# Patient Record
Sex: Female | Born: 1975 | Race: Black or African American | Hispanic: No | Marital: Single | Smoking: Never smoker
Health system: Southern US, Community
[De-identification: ages and names within clinical notes are randomized; demographics above are authoritative.]

## PROBLEM LIST (undated history)

## (undated) DIAGNOSIS — D649 Anemia, unspecified: Secondary | ICD-10-CM

## (undated) DIAGNOSIS — M5136 Other intervertebral disc degeneration, lumbar region: Secondary | ICD-10-CM

## (undated) DIAGNOSIS — E7800 Pure hypercholesterolemia, unspecified: Secondary | ICD-10-CM

## (undated) DIAGNOSIS — N809 Endometriosis, unspecified: Secondary | ICD-10-CM

## (undated) DIAGNOSIS — I10 Essential (primary) hypertension: Secondary | ICD-10-CM

## (undated) DIAGNOSIS — G549 Nerve root and plexus disorder, unspecified: Secondary | ICD-10-CM

## (undated) DIAGNOSIS — G43909 Migraine, unspecified, not intractable, without status migrainosus: Secondary | ICD-10-CM

## (undated) DIAGNOSIS — M62830 Muscle spasm of back: Secondary | ICD-10-CM

## (undated) DIAGNOSIS — R809 Proteinuria, unspecified: Secondary | ICD-10-CM

## (undated) DIAGNOSIS — F4310 Post-traumatic stress disorder, unspecified: Secondary | ICD-10-CM

## (undated) DIAGNOSIS — J45909 Unspecified asthma, uncomplicated: Secondary | ICD-10-CM

## (undated) DIAGNOSIS — Q6650 Congenital pes planus, unspecified foot: Secondary | ICD-10-CM

## (undated) DIAGNOSIS — F419 Anxiety disorder, unspecified: Secondary | ICD-10-CM

## (undated) DIAGNOSIS — M722 Plantar fascial fibromatosis: Secondary | ICD-10-CM

## (undated) DIAGNOSIS — K589 Irritable bowel syndrome without diarrhea: Secondary | ICD-10-CM

## (undated) HISTORY — DX: Anxiety disorder, unspecified: F41.9

## (undated) HISTORY — DX: Unspecified asthma, uncomplicated: J45.909

## (undated) HISTORY — DX: Endometriosis, unspecified: N80.9

## (undated) HISTORY — DX: Plantar fascial fibromatosis: M72.2

## (undated) HISTORY — PX: OTHER SURGICAL HISTORY: SHX169

## (undated) HISTORY — PX: DILATION AND CURETTAGE OF UTERUS: SHX78

## (undated) HISTORY — DX: Post-traumatic stress disorder, unspecified: F43.10

## (undated) HISTORY — PX: TONSILLECTOMY: SUR1361

## (undated) HISTORY — DX: Nerve root and plexus disorder, unspecified: G54.9

## (undated) HISTORY — DX: Other intervertebral disc degeneration, lumbar region: M51.36

## (undated) HISTORY — DX: Anemia, unspecified: D64.9

## (undated) HISTORY — DX: Proteinuria, unspecified: R80.9

## (undated) HISTORY — DX: Pure hypercholesterolemia, unspecified: E78.00

## (undated) HISTORY — PX: ESOPHAGOGASTRODUODENOSCOPY: SHX1529

## (undated) HISTORY — DX: Muscle spasm of back: M62.830

## (undated) HISTORY — DX: Congenital pes planus, unspecified foot: Q66.50

## (undated) HISTORY — DX: Essential (primary) hypertension: I10

## (undated) HISTORY — DX: Irritable bowel syndrome without diarrhea: K58.9

## (undated) HISTORY — PX: CYSTOURETHROSCOPY: SHX476

## (undated) HISTORY — DX: Migraine, unspecified, not intractable, without status migrainosus: G43.909

---

## 2012-10-31 DIAGNOSIS — I10 Essential (primary) hypertension: Secondary | ICD-10-CM | POA: Insufficient documentation

## 2012-10-31 DIAGNOSIS — F411 Generalized anxiety disorder: Secondary | ICD-10-CM | POA: Insufficient documentation

## 2012-10-31 DIAGNOSIS — E559 Vitamin D deficiency, unspecified: Secondary | ICD-10-CM | POA: Insufficient documentation

## 2012-10-31 DIAGNOSIS — E66813 Obesity, class 3: Secondary | ICD-10-CM | POA: Insufficient documentation

## 2012-10-31 DIAGNOSIS — F4310 Post-traumatic stress disorder, unspecified: Secondary | ICD-10-CM | POA: Insufficient documentation

## 2012-10-31 DIAGNOSIS — M545 Low back pain: Secondary | ICD-10-CM | POA: Insufficient documentation

## 2012-10-31 DIAGNOSIS — K921 Melena: Secondary | ICD-10-CM | POA: Insufficient documentation

## 2014-01-15 DIAGNOSIS — N8003 Adenomyosis of the uterus: Secondary | ICD-10-CM | POA: Insufficient documentation

## 2014-01-15 DIAGNOSIS — N8000 Endometriosis of the uterus, unspecified: Secondary | ICD-10-CM | POA: Insufficient documentation

## 2014-06-19 DIAGNOSIS — T8339XA Other mechanical complication of intrauterine contraceptive device, initial encounter: Secondary | ICD-10-CM | POA: Insufficient documentation

## 2015-10-21 DIAGNOSIS — N926 Irregular menstruation, unspecified: Secondary | ICD-10-CM | POA: Insufficient documentation

## 2015-10-21 DIAGNOSIS — A5901 Trichomonal vulvovaginitis: Secondary | ICD-10-CM | POA: Insufficient documentation

## 2015-10-24 DIAGNOSIS — J22 Unspecified acute lower respiratory infection: Secondary | ICD-10-CM | POA: Insufficient documentation

## 2016-03-02 DIAGNOSIS — D251 Intramural leiomyoma of uterus: Secondary | ICD-10-CM | POA: Insufficient documentation

## 2016-09-28 DIAGNOSIS — M62830 Muscle spasm of back: Secondary | ICD-10-CM | POA: Insufficient documentation

## 2016-10-13 DIAGNOSIS — R1900 Intra-abdominal and pelvic swelling, mass and lump, unspecified site: Secondary | ICD-10-CM | POA: Insufficient documentation

## 2016-10-13 DIAGNOSIS — N1339 Other hydronephrosis: Secondary | ICD-10-CM | POA: Insufficient documentation

## 2016-11-03 DIAGNOSIS — M5416 Radiculopathy, lumbar region: Secondary | ICD-10-CM | POA: Insufficient documentation

## 2016-11-07 DIAGNOSIS — N921 Excessive and frequent menstruation with irregular cycle: Secondary | ICD-10-CM | POA: Insufficient documentation

## 2016-11-07 DIAGNOSIS — N390 Urinary tract infection, site not specified: Secondary | ICD-10-CM

## 2016-11-07 DIAGNOSIS — Z860100 Personal history of colon polyps, unspecified: Secondary | ICD-10-CM | POA: Insufficient documentation

## 2016-11-07 DIAGNOSIS — A419 Sepsis, unspecified organism: Secondary | ICD-10-CM | POA: Insufficient documentation

## 2016-11-07 DIAGNOSIS — N135 Crossing vessel and stricture of ureter without hydronephrosis: Secondary | ICD-10-CM | POA: Insufficient documentation

## 2016-12-22 DIAGNOSIS — R5383 Other fatigue: Secondary | ICD-10-CM | POA: Insufficient documentation

## 2017-01-10 DIAGNOSIS — D649 Anemia, unspecified: Secondary | ICD-10-CM | POA: Insufficient documentation

## 2017-03-04 DIAGNOSIS — R8271 Bacteriuria: Secondary | ICD-10-CM | POA: Insufficient documentation

## 2017-03-23 DIAGNOSIS — M722 Plantar fascial fibromatosis: Secondary | ICD-10-CM | POA: Insufficient documentation

## 2017-04-12 DIAGNOSIS — M51379 Other intervertebral disc degeneration, lumbosacral region without mention of lumbar back pain or lower extremity pain: Secondary | ICD-10-CM | POA: Insufficient documentation

## 2017-04-12 DIAGNOSIS — M5030 Other cervical disc degeneration, unspecified cervical region: Secondary | ICD-10-CM | POA: Insufficient documentation

## 2017-04-12 DIAGNOSIS — M5137 Other intervertebral disc degeneration, lumbosacral region: Secondary | ICD-10-CM | POA: Insufficient documentation

## 2017-04-12 DIAGNOSIS — Z9884 Bariatric surgery status: Secondary | ICD-10-CM | POA: Insufficient documentation

## 2017-05-23 DIAGNOSIS — N2889 Other specified disorders of kidney and ureter: Secondary | ICD-10-CM | POA: Insufficient documentation

## 2017-07-22 DIAGNOSIS — N3001 Acute cystitis with hematuria: Secondary | ICD-10-CM | POA: Insufficient documentation

## 2017-08-10 DIAGNOSIS — R059 Cough, unspecified: Secondary | ICD-10-CM | POA: Insufficient documentation

## 2017-08-10 DIAGNOSIS — R05 Cough: Secondary | ICD-10-CM | POA: Insufficient documentation

## 2017-08-10 DIAGNOSIS — H1033 Unspecified acute conjunctivitis, bilateral: Secondary | ICD-10-CM | POA: Insufficient documentation

## 2017-08-10 DIAGNOSIS — J0100 Acute maxillary sinusitis, unspecified: Secondary | ICD-10-CM | POA: Insufficient documentation

## 2018-01-27 DIAGNOSIS — J4520 Mild intermittent asthma, uncomplicated: Secondary | ICD-10-CM | POA: Insufficient documentation

## 2018-08-25 ENCOUNTER — Encounter (INDEPENDENT_AMBULATORY_CARE_PROVIDER_SITE_OTHER): Payer: Self-pay

## 2018-08-25 ENCOUNTER — Encounter: Payer: Self-pay | Admitting: Oncology

## 2018-08-25 ENCOUNTER — Other Ambulatory Visit: Payer: Self-pay

## 2018-08-25 ENCOUNTER — Inpatient Hospital Stay: Payer: Federal, State, Local not specified - PPO | Admitting: *Deleted

## 2018-08-25 ENCOUNTER — Inpatient Hospital Stay: Payer: Federal, State, Local not specified - PPO | Attending: Oncology | Admitting: Oncology

## 2018-08-25 VITALS — BP 143/85 | HR 82 | Temp 97.8°F | Ht 68.0 in | Wt 216.1 lb

## 2018-08-25 DIAGNOSIS — Z9884 Bariatric surgery status: Secondary | ICD-10-CM | POA: Diagnosis not present

## 2018-08-25 DIAGNOSIS — D649 Anemia, unspecified: Secondary | ICD-10-CM

## 2018-08-25 DIAGNOSIS — D509 Iron deficiency anemia, unspecified: Secondary | ICD-10-CM | POA: Insufficient documentation

## 2018-08-25 LAB — CBC
HCT: 39 % (ref 36.0–46.0)
Hemoglobin: 12.7 g/dL (ref 12.0–15.0)
MCH: 27 pg (ref 26.0–34.0)
MCHC: 32.6 g/dL (ref 30.0–36.0)
MCV: 82.8 fL (ref 80.0–100.0)
Platelets: 339 10*3/uL (ref 150–400)
RBC: 4.71 MIL/uL (ref 3.87–5.11)
RDW: 15.1 % (ref 11.5–15.5)
WBC: 4.4 10*3/uL (ref 4.0–10.5)
nRBC: 0 % (ref 0.0–0.2)

## 2018-08-25 LAB — RETICULOCYTES
Immature Retic Fract: 5.5 % (ref 2.3–15.9)
RBC.: 4.71 MIL/uL (ref 3.87–5.11)
RETIC CT PCT: 1.1 % (ref 0.4–3.1)
Retic Count, Absolute: 50.9 10*3/uL (ref 19.0–186.0)

## 2018-08-25 LAB — URINALYSIS, COMPLETE (UACMP) WITH MICROSCOPIC
BACTERIA UA: NONE SEEN
Bilirubin Urine: NEGATIVE
Glucose, UA: NEGATIVE mg/dL
Hgb urine dipstick: NEGATIVE
Ketones, ur: NEGATIVE mg/dL
Leukocytes,Ua: NEGATIVE
Nitrite: NEGATIVE
Protein, ur: NEGATIVE mg/dL
Specific Gravity, Urine: 1.026 (ref 1.005–1.030)
pH: 6 (ref 5.0–8.0)

## 2018-08-25 LAB — IRON AND TIBC
Iron: 41 ug/dL (ref 28–170)
Saturation Ratios: 9 % — ABNORMAL LOW (ref 10.4–31.8)
TIBC: 441 ug/dL (ref 250–450)
UIBC: 400 ug/dL

## 2018-08-25 LAB — FOLATE: Folate: 32 ng/mL (ref >5.9)

## 2018-08-25 LAB — VITAMIN B12: Vitamin B-12: 1121 pg/mL — ABNORMAL HIGH (ref 180–914)

## 2018-08-25 LAB — FERRITIN: Ferritin: 9 ng/mL — ABNORMAL LOW (ref 11–307)

## 2018-08-25 NOTE — Progress Notes (Signed)
Patient is here today to establish care for anemia. 

## 2018-08-28 ENCOUNTER — Encounter: Payer: Self-pay | Admitting: Oncology

## 2018-08-28 DIAGNOSIS — D509 Iron deficiency anemia, unspecified: Secondary | ICD-10-CM | POA: Insufficient documentation

## 2018-08-28 NOTE — Progress Notes (Signed)
Hematology/Oncology Consult note [facility] [phone] [phone]  Patient Care Team: Remi Haggard, FNP as PCP - General (Family Medicine)   Name of the patient: Jacqueline Caldwell  [id number]  11/10/1975    Reason for referral- anemia   Referring physician- Threasa Alpha FNP  Date of visit: 08/28/18   History of presenting illness-patient is a 43 year old African-American female with a past medical history significant for gastric bypass surgery in 2014.  She has also had history of endometriosis which affected her left kidney and she has required multiple excisions for the same.  She was almost to the point of losing her left kidney but was able to get that salvaged.  She has also had EGD and colonoscopy done in the past at [facility] in 2018.  Colonoscopy was normal and EGD showed 4 mm nodules in the stomach which were negative for malignancy she is currently on Lupron for endometriosis and since then she has not had her menstrual cycles.  She has been referred to [place] for iron deficiency anemia.  Recent blood work from 08/07/2018 showed white count of 3.5, H&H of 12.5/38.4 with an MCV of 81 and a platelet count of 266.  CMP was within normal limits.  Iron saturation was low at 8%.  TSH was high at 4.6 with a low T3 of 23.  B12 was high normal at 1590.  Folate was normal.  Ferritin was low at 24.  Patient denies any consistent use of NSAIDs.  Denies any blood in her stools or dark melanotic stools.  ECOG PS- 0  Pain scale- 0   Review of systems- Review of Systems  Constitutional: Negative for chills, fever, malaise/fatigue and weight loss.  HENT: Negative for congestion, ear discharge and nosebleeds.   Eyes: Negative for blurred vision.  Respiratory: Negative for cough, hemoptysis, sputum production, shortness of breath and wheezing.   Cardiovascular: Negative for chest pain, palpitations, orthopnea and claudication.  Gastrointestinal:  Negative for abdominal pain, blood in stool, constipation, diarrhea, heartburn, melena, nausea and vomiting.  Genitourinary: Negative for dysuria, flank pain, frequency, hematuria and urgency.  Musculoskeletal: Negative for back pain, joint pain and myalgias.  Skin: Negative for rash.  Neurological: Negative for dizziness, tingling, focal weakness, seizures, weakness and headaches.  Endo/Heme/Allergies: Does not bruise/bleed easily.  Psychiatric/Behavioral: Negative for depression and suicidal ideas. The patient does not have insomnia.     Allergies  Allergen Reactions  . Levofloxacin Hives    Other reaction(s): Unknown  . Tape     Other reaction(s): Other (See Comments) Burns the skin.     Patient Active Problem List   Diagnosis Date Noted  . Mild intermittent asthma without complication 01/13/1974  . Acute bacterial conjunctivitis of both eyes 08/10/2017  . Acute non-recurrent maxillary sinusitis 08/10/2017  . Cough 08/10/2017  . Acute cystitis with hematuria 07/22/2017  . Ureteral mass 05/23/2017  . DDD (degenerative disc disease), cervical 04/12/2017  . Degeneration of lumbosacral intervertebral disc 04/12/2017  . History of bariatric surgery 04/12/2017  . Plantar fasciitis 03/23/2017  . Bacteria in urine 03/04/2017  . Absolute anemia 01/10/2017  . Other fatigue 12/22/2016  . History of colon polyps 11/07/2016  . Menometrorrhagia 11/07/2016  . Sepsis due to urinary tract infection ([place]) 11/07/2016  . Ureteral obstruction, left 11/07/2016  . Lumbar radiculopathy, chronic 11/03/2016  . Other hydronephrosis 10/13/2016  . Retroperitoneal mass 10/13/2016  . Back muscle spasm 09/28/2016  . Intramural leiomyoma of uterus 03/02/2016  . Lower  resp. tract infection 10/24/2015  . Irregular menses 10/21/2015  . Trichomonal vaginitis 10/21/2015  . IUD mechanical complication 27/09/5007  . Uterus, adenomyosis 01/15/2014  . Anxiety state 10/31/2012  . Essential hypertension  10/31/2012  . Hematochezia 10/31/2012  . Low back pain 10/31/2012  . Obesity, Class III, BMI 40-49.9 (morbid obesity) ([place]) 10/31/2012  . Posttraumatic stress disorder 10/31/2012  . Vitamin D deficiency 10/31/2012     Past Medical History:  Diagnosis Date  . Anemia   . Anxiety disorder   . Asthma   . Congenital pes planus   . Endometriosis   . Endometriosis   . Essential hypertension   . IBS (irritable bowel syndrome)   . Migraine   . Muscle spasm of back   . Nerve root and plexus disorder, unspecified   . Other intervertebral disc degeneration, lumbar region   . Plantar fascial fibromatosis   . Proteinuria   . PTSD (post-traumatic stress disorder)      Past Surgical History:  Procedure Laterality Date  . APPENDECTOMY    . biopsy cervix single/mult/excision of lesion spx    . CYSTOURETHROSCOPY    . DILATION AND CURETTAGE OF UTERUS    . endometrial bx conjunct w/ colposcopy    . ESOPHAGOGASTRODUODENOSCOPY    . laps gastrc rstrictiv px longitudinal gastrectomy    . TONSILLECTOMY    . ureteroplasty plastic operation ureter      Social History   Socioeconomic History  . Marital status: Single    Spouse name: Not on file  . Number of children: Not on file  . Years of education: Not on file  . Highest education level: Not on file  Occupational History  . Not on file  Social Needs  . Financial resource strain: Not on file  . Food insecurity:    Worry: Not on file    Inability: Not on file  . Transportation needs:    Medical: Not on file    Non-medical: Not on file  Tobacco Use  . Smoking status: Never Smoker  . Smokeless tobacco: Never Used  Substance and Sexual Activity  . Alcohol use: Yes  . Drug use: Never  . Sexual activity: Not on file  Lifestyle  . Physical activity:    Days per week: Not on file    Minutes per session: Not on file  . Stress: Not on file  Relationships  . Social connections:    Talks on phone: Not on file    Gets together: Not  on file    Attends religious service: Not on file    Active member of club or organization: Not on file    Attends meetings of clubs or organizations: Not on file    Relationship status: Not on file  . Intimate partner violence:    Fear of current or ex partner: Not on file    Emotionally abused: Not on file    Physically abused: Not on file    Forced sexual activity: Not on file  Other Topics Concern  . Not on file  Social History Narrative  . Not on file     History reviewed. No pertinent family history.   Current Outpatient Medications:  .  albuterol (PROVENTIL HFA;VENTOLIN HFA) 108 (90 Base) MCG/ACT inhaler, Inhale 2 puffs into the lungs as needed., Disp: , Rfl:  .  ARIPiprazole (ABILIFY) 10 MG tablet, Take 1 tablet by mouth 1 day or 1 dose., Disp: , Rfl:  .  b complex vitamins tablet,  Take 1 tablet by mouth 1 day or 1 dose., Disp: , Rfl:  .  Calcium 500-100 MG-UNIT CHEW, Chew 1 tablet by mouth., Disp: , Rfl:  .  Cyanocobalamin (VITAMIN B 12) 500 MCG TABS, Take 1 tablet by mouth., Disp: , Rfl:  .  dicyclomine (BENTYL) 10 MG capsule, Take 1 mg by mouth 1 day or 1 dose., Disp: , Rfl:  .  docusate sodium (COLACE) 100 MG capsule, Take 1 capsule by mouth 1 day or 1 dose., Disp: , Rfl:  .  escitalopram (LEXAPRO) 10 MG tablet, Take 1 tablet by mouth 1 day or 1 dose., Disp: , Rfl:  .  FLUoxetine (PROZAC) 40 MG capsule, Take 1 capsule by mouth 1 day or 1 dose., Disp: , Rfl:  .  Fluticasone-Salmeterol (ADVAIR) 250-50 MCG/DOSE AEPB, Inhale 1 puff into the lungs 1 day or 1 dose., Disp: , Rfl:  .  gabapentin (NEURONTIN) 100 MG capsule, Take 1 capsule by mouth 2 (two) times daily., Disp: , Rfl:  .  linaclotide (LINZESS) 72 MCG capsule, Take 1 capsule by mouth 1 day or 1 dose., Disp: , Rfl:  .  methocarbamol (ROBAXIN) 750 MG tablet, Take 1 tablet by mouth 1 day or 1 dose., Disp: , Rfl:  .  montelukast (SINGULAIR) 10 MG tablet, Take 1 tablet by mouth 1 day or 1 dose., Disp: , Rfl:  .  Multiple  Vitamins-Minerals (COMPLETE MULTIVITAMIN/MINERAL) LIQD, Take 1 tablet by mouth 1 day or 1 dose., Disp: , Rfl:  .  prazosin (MINIPRESS) 1 MG capsule, Take 1 capsule by mouth 1 day or 1 dose., Disp: , Rfl:  .  SUMAtriptan (IMITREX) 50 MG tablet, Take 50 mg by mouth daily., Disp: , Rfl:  .  traZODone (DESYREL) 50 MG tablet, Take 1 tablet by mouth as needed., Disp: , Rfl:    Physical exam:  Vitals:   08/25/18 1105  BP: (!) 143/85  Pulse: 82  Temp: 97.8 F (36.6 C)  TempSrc: Tympanic  Weight: 216 lb 1.6 oz (98 kg)  Height: 5\' 8"  (1.727 m)   Physical Exam Constitutional:      General: She is not in acute distress. HENT:     Head: Normocephalic and atraumatic.  Eyes:     Pupils: Pupils are equal, round, and reactive to light.  Neck:     Musculoskeletal: Normal range of motion.  Cardiovascular:     Rate and Rhythm: Normal rate and regular rhythm.     Heart sounds: Normal heart sounds.  Pulmonary:     Effort: Pulmonary effort is normal.     Breath sounds: Normal breath sounds.  Abdominal:     General: Bowel sounds are normal.     Palpations: Abdomen is soft.  Skin:    General: Skin is warm and dry.  Neurological:     Mental Status: She is alert and oriented to person, place, and time.        No flowsheet data found. CBC Latest Ref Rng & Units 08/25/2018  WBC 4.0 - 10.5 K/uL 4.4  Hemoglobin 12.0 - 15.0 g/dL 12.7  Hematocrit 36.0 - 46.0 % 39.0  Platelets 150 - 400 K/uL 339    No images are attached to the encounter.  No results found.  Assessment and plan- Patient is a 43 y.o. female referred for iron deficiency anemia  Iron deficiency anemia possibly secondary to gastric bypass.  She is currently on Lupron and is not getting her menstrual cycles.  She has had EGD  and colonoscopy in 2018.  Today I will check a CBC, ferritin and iron studies, B12 and folate, reticulocyte count stool H. pylori antigen as well as urinalysis.  If the results of her blood work are consistent  with iron deficiency we will Give her a call and set her up for IV iron  I would recommend 2 doses of Feraheme 500 mg IV weekly.  Discussed risks and benefits of IV iron including all but not limited to headache, leg swelling and possible risk of infusion reaction.  Patient understands and agrees to proceed as planned  I will see her back in 2 months time with repeat CBC ferritin and iron studies and discuss potential GI evaluation at that time.  Patient has now moved her care to [facility] and does not wish to go back to [facility] for her subsequent endoscopies if need be.   Thank you for this kind referral and the opportunity to participate in the care of this patient   Visit Diagnosis 1. Iron deficiency anemia, unspecified iron deficiency anemia type     Dr. Randa Evens, MD, MPH [facility] [phone] 08/28/2018  4:48 PM

## 2018-08-29 NOTE — Progress Notes (Signed)
Verdis Frederickson called patient and I was by her side and told patient ferritin results and pt agreeable for 2 doses feraheme and appts given to patient and scheduled by Verdis Frederickson

## 2018-08-30 ENCOUNTER — Other Ambulatory Visit: Payer: Self-pay | Admitting: *Deleted

## 2018-08-30 DIAGNOSIS — D509 Iron deficiency anemia, unspecified: Secondary | ICD-10-CM

## 2018-09-01 ENCOUNTER — Inpatient Hospital Stay: Payer: Federal, State, Local not specified - PPO

## 2018-09-01 ENCOUNTER — Encounter (INDEPENDENT_AMBULATORY_CARE_PROVIDER_SITE_OTHER): Payer: Self-pay

## 2018-09-01 VITALS — BP 117/70

## 2018-09-01 DIAGNOSIS — D509 Iron deficiency anemia, unspecified: Secondary | ICD-10-CM | POA: Diagnosis not present

## 2018-09-01 LAB — H. PYLORI ANTIGEN, STOOL: H. PYLORI STOOL AG, EIA: NEGATIVE

## 2018-09-01 MED ORDER — SODIUM CHLORIDE 0.9 % IV SOLN
Freq: Once | INTRAVENOUS | Status: AC
  Administered 2018-09-01: 12:00:00 via INTRAVENOUS
  Filled 2018-09-01: qty 250

## 2018-09-01 MED ORDER — SODIUM CHLORIDE 0.9 % IV SOLN
510.0000 mg | Freq: Once | INTRAVENOUS | Status: AC
  Administered 2018-09-01: 510 mg via INTRAVENOUS
  Filled 2018-09-01: qty 17

## 2018-09-08 ENCOUNTER — Inpatient Hospital Stay: Payer: Federal, State, Local not specified - PPO | Attending: Oncology

## 2018-09-08 VITALS — BP 124/85 | HR 64 | Temp 96.9°F | Resp 18

## 2018-09-08 DIAGNOSIS — D509 Iron deficiency anemia, unspecified: Secondary | ICD-10-CM | POA: Diagnosis present

## 2018-09-08 MED ORDER — SODIUM CHLORIDE 0.9 % IV SOLN
510.0000 mg | Freq: Once | INTRAVENOUS | Status: AC
  Administered 2018-09-08: 510 mg via INTRAVENOUS
  Filled 2018-09-08: qty 17

## 2018-09-08 MED ORDER — SODIUM CHLORIDE 0.9 % IV SOLN
Freq: Once | INTRAVENOUS | Status: AC
  Administered 2018-09-08: 14:00:00 via INTRAVENOUS
  Filled 2018-09-08: qty 250

## 2018-09-12 ENCOUNTER — Encounter: Payer: Self-pay | Admitting: Obstetrics and Gynecology

## 2018-09-13 ENCOUNTER — Encounter: Payer: Self-pay | Admitting: Obstetrics and Gynecology

## 2018-10-30 ENCOUNTER — Encounter: Payer: Self-pay | Admitting: Obstetrics and Gynecology

## 2018-10-30 ENCOUNTER — Ambulatory Visit (INDEPENDENT_AMBULATORY_CARE_PROVIDER_SITE_OTHER): Payer: Federal, State, Local not specified - PPO | Admitting: Obstetrics and Gynecology

## 2018-10-30 ENCOUNTER — Other Ambulatory Visit: Payer: Self-pay

## 2018-10-30 DIAGNOSIS — N809 Endometriosis, unspecified: Secondary | ICD-10-CM

## 2018-10-30 NOTE — Progress Notes (Signed)
Virtual Visit via Telephone Note  I connected with Jacqueline Caldwell on 10/30/18 at  2:30 PM EDT by telephone and verified that I am speaking with the correct person using two identifiers.   I discussed the limitations, risks, security and privacy concerns of performing an evaluation and management service by telephone and the availability of in person appointments. I also discussed with the patient that there may be a patient responsible charge related to this service. The patient expressed understanding and agreed to proceed.  The patient was at home I spoke with the patient from my office The names of people involved in this encounter were: Jacqueline Caldwell and Prentice Docker, MD.   History of Present Illness: 43 y.o. [phone] who is seen in referral from Threasa Alpha, [place], from [facility], for a history of endometriosis.  She is seeking a second opinion.  She recently moved from [facility] near [place]. Her care was from [facility]  She has a history of severe endometriosis. She states that she has been on Depo Lupron for over a year. She had lesions on her left kidney. These have been removed.  She still has her uterus.  It has been recommended that she take another form of contraception, have a child.  She states she has a copper IUD (Paragard).  She has had a regular IUD twice. The IUDs shifted and she never stopped bleeding.  She has not had a period while on Lupron.  She had bariatric surgery 06/2016.  She has to get iron infusions due to low iron.  Her last Lupron injection was in January. She had another prescription called in to be given in April. She has not filled this yet.  Dr. Anabel Bene is her gynecologist at [facility]. The prescription given by Dr. Anabel Bene was for 1 year.  I review of Dr. Margit Banda most recent note states that the patient has been on Lupron without add back therapy for about 18 months.  The patient would like to know what treatment options she has beyond  Lupron, if they exist.  Past Medical History:  Diagnosis Date   Anemia    Anxiety disorder    Asthma    Congenital pes planus    Endometriosis    Endometriosis    Essential hypertension    Hypercholesterolemia    IBS (irritable bowel syndrome)    Migraine    Muscle spasm of back    Nerve root and plexus disorder, unspecified    Other intervertebral disc degeneration, lumbar region    Plantar fascial fibromatosis    Proteinuria    PTSD (post-traumatic stress disorder)    Past Surgical History:  Procedure Laterality Date   biopsy cervix single/mult/excision of lesion spx     CYSTOURETHROSCOPY     DILATION AND CURETTAGE OF UTERUS     endometrial bx conjunct w/ colposcopy     ESOPHAGOGASTRODUODENOSCOPY     laps gastrc rstrictiv px longitudinal gastrectomy     TONSILLECTOMY     ureteroplasty plastic operation ureter     Current Outpatient Medications on File Prior to Visit  Medication Sig Dispense Refill   albuterol (PROVENTIL HFA;VENTOLIN HFA) 108 (90 Base) MCG/ACT inhaler Inhale 2 puffs into the lungs as needed.     ARIPiprazole (ABILIFY) 10 MG tablet Take 1 tablet by mouth 1 day or 1 dose.     b complex vitamins tablet Take 1 tablet by mouth 1 day or 1 dose.     Calcium  500-100 MG-UNIT CHEW Chew 1 tablet by mouth.     Cyanocobalamin (VITAMIN B 12) 500 MCG TABS Take 1 tablet by mouth.     dicyclomine (BENTYL) 10 MG capsule Take 1 mg by mouth 1 day or 1 dose.     docusate sodium (COLACE) 100 MG capsule Take 1 capsule by mouth 1 day or 1 dose.     escitalopram (LEXAPRO) 10 MG tablet Take 1 tablet by mouth 1 day or 1 dose.     FLUoxetine (PROZAC) 40 MG capsule Take 1 capsule by mouth 1 day or 1 dose.     Fluticasone-Salmeterol (ADVAIR) 250-50 MCG/DOSE AEPB Inhale 1 puff into the lungs 1 day or 1 dose.     gabapentin (NEURONTIN) 100 MG capsule Take 1 capsule by mouth 2 (two) times daily.     linaclotide (LINZESS) 72 MCG capsule Take 1 capsule  by mouth 1 day or 1 dose.     methocarbamol (ROBAXIN) 750 MG tablet Take 1 tablet by mouth 1 day or 1 dose.     montelukast (SINGULAIR) 10 MG tablet Take 1 tablet by mouth 1 day or 1 dose.     Multiple Vitamins-Minerals (COMPLETE MULTIVITAMIN/MINERAL) LIQD Take 1 tablet by mouth 1 day or 1 dose.     prazosin (MINIPRESS) 1 MG capsule Take 1 capsule by mouth 1 day or 1 dose.     SUMAtriptan (IMITREX) 50 MG tablet Take 50 mg by mouth daily.     traZODone (DESYREL) 50 MG tablet Take 1 tablet by mouth as needed.     No current facility-administered medications on file prior to visit.     Allergies  Allergen Reactions   Levofloxacin Hives    Other reaction(s): Unknown   Tape     Other reaction(s): Other (See Comments) Burns the skin.    Family History  Problem Relation Age of Onset   Leukemia Mother    Diabetes Father    Hypertension Father     Review of Systems  Constitutional: Negative.   HENT: Negative.   Eyes: Negative.   Respiratory: Negative.   Cardiovascular: Negative.   Gastrointestinal: Negative.   Genitourinary: Negative.   Musculoskeletal: Negative.   Skin: Negative.   Neurological: Negative.   Psychiatric/Behavioral: Negative.     Observations/Objective: Physical Exam could not be performed. Because of the COVID-19 outbreak this visit was performed over the phone and not in person.   Assessment and Plan: 43 y.o. [id number] female with endometriosis.   Follow Up Instructions: This is a very complicated patient, given her comorbidities and severe endometriosis.  She desires to move her care to this area.  After a long discussion, several possibilities arose.  We discussed a referral to the [facility] pelvic pain clinic for ongoing management.  The patient was quite happy with her care in [place], which included a good working relationship between her gynecologist and urologist.  We discussed in great detail the risks of continuing Lupron therapy for extended periods  of time.  It appears from visits with Dr. Anabel Bene that this is also been discussed with her prior gynecologist.  In the end, she would like to think about it given that she has good care in [facility] and that I would likely require she be seen in consultation at [facility] prior to fully assuming her care.  Additionally, she would need to establish with a local urologist.   I discussed the assessment and treatment plan with the patient. The patient was provided an opportunity to  ask questions and all were answered. The patient agreed with the plan and demonstrated an understanding of the instructions.   The patient was advised to call back or seek an in-person evaluation if the symptoms worsen or if the condition fails to improve as anticipated.  I provided 44 minutes of non-face-to-face time during this encounter.  Prentice Docker, MD  [facility], [facility] 10/30/2018 2:52 PM

## 2018-11-02 ENCOUNTER — Encounter: Payer: Self-pay | Admitting: Obstetrics and Gynecology

## 2018-11-02 ENCOUNTER — Other Ambulatory Visit: Payer: Self-pay | Admitting: *Deleted

## 2018-11-02 ENCOUNTER — Other Ambulatory Visit: Payer: Self-pay

## 2018-11-02 DIAGNOSIS — D509 Iron deficiency anemia, unspecified: Secondary | ICD-10-CM

## 2018-11-03 ENCOUNTER — Other Ambulatory Visit: Payer: Federal, State, Local not specified - PPO

## 2018-11-03 ENCOUNTER — Other Ambulatory Visit: Payer: Self-pay

## 2018-11-03 ENCOUNTER — Inpatient Hospital Stay: Payer: Federal, State, Local not specified - PPO | Admitting: Oncology

## 2018-11-03 ENCOUNTER — Inpatient Hospital Stay: Payer: Federal, State, Local not specified - PPO | Attending: Oncology

## 2018-11-03 DIAGNOSIS — J45909 Unspecified asthma, uncomplicated: Secondary | ICD-10-CM | POA: Diagnosis not present

## 2018-11-03 DIAGNOSIS — I10 Essential (primary) hypertension: Secondary | ICD-10-CM | POA: Insufficient documentation

## 2018-11-03 DIAGNOSIS — D509 Iron deficiency anemia, unspecified: Secondary | ICD-10-CM | POA: Diagnosis present

## 2018-11-03 DIAGNOSIS — Z7951 Long term (current) use of inhaled steroids: Secondary | ICD-10-CM | POA: Insufficient documentation

## 2018-11-03 DIAGNOSIS — Z79899 Other long term (current) drug therapy: Secondary | ICD-10-CM | POA: Diagnosis not present

## 2018-11-03 DIAGNOSIS — K589 Irritable bowel syndrome without diarrhea: Secondary | ICD-10-CM | POA: Diagnosis not present

## 2018-11-03 DIAGNOSIS — Z9884 Bariatric surgery status: Secondary | ICD-10-CM | POA: Diagnosis not present

## 2018-11-03 DIAGNOSIS — E7800 Pure hypercholesterolemia, unspecified: Secondary | ICD-10-CM | POA: Diagnosis not present

## 2018-11-03 LAB — CBC
HCT: 42.6 % (ref 36.0–46.0)
Hemoglobin: 14.4 g/dL (ref 12.0–15.0)
MCH: 28.9 pg (ref 26.0–34.0)
MCHC: 33.8 g/dL (ref 30.0–36.0)
MCV: 85.4 fL (ref 80.0–100.0)
Platelets: 262 10*3/uL (ref 150–400)
RBC: 4.99 MIL/uL (ref 3.87–5.11)
RDW: 14 % (ref 11.5–15.5)
WBC: 3.3 10*3/uL — ABNORMAL LOW (ref 4.0–10.5)
nRBC: 0 % (ref 0.0–0.2)

## 2018-11-03 LAB — IRON AND TIBC
Iron: 110 ug/dL (ref 28–170)
Saturation Ratios: 28 % (ref 10.4–31.8)
TIBC: 394 ug/dL (ref 250–450)
UIBC: 284 ug/dL

## 2018-11-03 LAB — FERRITIN: Ferritin: 93 ng/mL (ref 11–307)

## 2018-11-07 ENCOUNTER — Inpatient Hospital Stay (HOSPITAL_BASED_OUTPATIENT_CLINIC_OR_DEPARTMENT_OTHER): Payer: Federal, State, Local not specified - PPO | Admitting: Oncology

## 2018-11-07 DIAGNOSIS — I10 Essential (primary) hypertension: Secondary | ICD-10-CM | POA: Diagnosis not present

## 2018-11-07 DIAGNOSIS — D509 Iron deficiency anemia, unspecified: Secondary | ICD-10-CM | POA: Diagnosis not present

## 2018-11-07 DIAGNOSIS — Z79899 Other long term (current) drug therapy: Secondary | ICD-10-CM

## 2018-11-07 NOTE — Progress Notes (Signed)
She is still tired but better, she does get days where she is extremely tired and takes nap.

## 2018-11-09 NOTE — Progress Notes (Signed)
I connected with Jacqueline Caldwell on 11/09/18 at  1:15 PM EDT by video enabled telemedicine visit and verified that I am speaking with the correct person using two identifiers.   I discussed the limitations, risks, security and privacy concerns of performing an evaluation and management service by telemedicine and the availability of in-person appointments. I also discussed with the patient that there may be a patient responsible charge related to this service. The patient expressed understanding and agreed to proceed.  Other persons participating in the visit and their role in the encounter:  none  Patient's location:  home Provider's location:  home  Chief Complaint: routine f/u of iron deficiency anemia  History of present illness: patient is a 43 year old African-American female with a past medical history significant for gastric bypass surgery in 2014.  She has also had history of endometriosis which affected her left kidney and she has required multiple excisions for the same.  She was almost to the point of losing her left kidney but was able to get that salvaged.  She has also had EGD and colonoscopy done in the past at [facility] in 2018.  Colonoscopy was normal and EGD showed 4 mm nodules in the stomach which were negative for malignancy she is currently on Lupron for endometriosis and since then she has not had her menstrual cycles.  She has been referred to [place] for iron deficiency anemia.  Recent blood work from 08/07/2018 showed white count of 3.5, H&H of 12.5/38.4 with an MCV of 81 and a platelet count of 266.  CMP was within normal limits.  Iron saturation was low at 8%.  TSH was high at 4.6 with a low T3 of 23.  B12 was high normal at 1590.  Folate was normal.  Ferritin was low at 24.  Patient denies any consistent use of NSAIDs.  Denies any blood in her stools or dark melanotic stools.  Patient received 2 doses of feraheme in feb 2020.  Interval history. Fatigue has improved but still  persists and is intermittent   Review of Systems  Constitutional: Positive for malaise/fatigue. Negative for chills, fever and weight loss.  HENT: Negative for congestion, ear discharge and nosebleeds.   Eyes: Negative for blurred vision.  Respiratory: Negative for cough, hemoptysis, sputum production, shortness of breath and wheezing.   Cardiovascular: Negative for chest pain, palpitations, orthopnea and claudication.  Gastrointestinal: Negative for abdominal pain, blood in stool, constipation, diarrhea, heartburn, melena, nausea and vomiting.  Genitourinary: Negative for dysuria, flank pain, frequency, hematuria and urgency.  Musculoskeletal: Negative for back pain, joint pain and myalgias.  Skin: Negative for rash.  Neurological: Negative for dizziness, tingling, focal weakness, seizures, weakness and headaches.  Endo/Heme/Allergies: Does not bruise/bleed easily.  Psychiatric/Behavioral: Negative for depression and suicidal ideas. The patient does not have insomnia.     Allergies  Allergen Reactions  . Levofloxacin Hives    Other reaction(s): Unknown  . Tape     Other reaction(s): Other (See Comments) Burns the skin.     Past Medical History:  Diagnosis Date  . Anemia   . Anxiety disorder   . Asthma   . Congenital pes planus   . Endometriosis   . Endometriosis   . Essential hypertension   . Hypercholesterolemia   . IBS (irritable bowel syndrome)   . Migraine   . Muscle spasm of back   . Nerve root and plexus disorder, unspecified   . Other intervertebral disc degeneration, lumbar region   . Plantar  fascial fibromatosis   . Proteinuria   . PTSD (post-traumatic stress disorder)     Past Surgical History:  Procedure Laterality Date  . biopsy cervix single/mult/excision of lesion spx    . CYSTOURETHROSCOPY    . DILATION AND CURETTAGE OF UTERUS    . endometrial bx conjunct w/ colposcopy    . ESOPHAGOGASTRODUODENOSCOPY    . laps gastrc rstrictiv px longitudinal  gastrectomy    . TONSILLECTOMY    . ureteroplasty plastic operation ureter      Social History   Socioeconomic History  . Marital status: Single    Spouse name: Not on file  . Number of children: Not on file  . Years of education: Not on file  . Highest education level: Not on file  Occupational History  . Not on file  Social Needs  . Financial resource strain: Not on file  . Food insecurity:    Worry: Not on file    Inability: Not on file  . Transportation needs:    Medical: Not on file    Non-medical: Not on file  Tobacco Use  . Smoking status: Never Smoker  . Smokeless tobacco: Never Used  Substance and Sexual Activity  . Alcohol use: Yes  . Drug use: Never  . Sexual activity: Not Currently    Birth control/protection: I.U.D.  Lifestyle  . Physical activity:    Days per week: Not on file    Minutes per session: Not on file  . Stress: Not on file  Relationships  . Social connections:    Talks on phone: Not on file    Gets together: Not on file    Attends religious service: Not on file    Active member of club or organization: Not on file    Attends meetings of clubs or organizations: Not on file    Relationship status: Not on file  . Intimate partner violence:    Fear of current or ex partner: Not on file    Emotionally abused: Not on file    Physically abused: Not on file    Forced sexual activity: Not on file  Other Topics Concern  . Not on file  Social History Narrative  . Not on file    Family History  Problem Relation Age of Onset  . Leukemia Mother   . Diabetes Father   . Hypertension Father      Current Outpatient Medications:  .  albuterol (PROVENTIL HFA;VENTOLIN HFA) 108 (90 Base) MCG/ACT inhaler, Inhale 2 puffs into the lungs as needed., Disp: , Rfl:  .  ARIPiprazole (ABILIFY) 10 MG tablet, Take 10 mg by mouth daily. , Disp: , Rfl:  .  b complex vitamins tablet, Take 1 tablet by mouth daily. , Disp: , Rfl:  .  Calcium 500-100 MG-UNIT  CHEW, Chew 1 tablet by mouth daily. , Disp: , Rfl:  .  Cyanocobalamin (VITAMIN B 12) 500 MCG TABS, Take 1 tablet by mouth daily. , Disp: , Rfl:  .  dicyclomine (BENTYL) 10 MG capsule, Take 10 mg by mouth daily. , Disp: , Rfl:  .  docusate sodium (COLACE) 100 MG capsule, Take 100 mg by mouth daily. , Disp: , Rfl:  .  escitalopram (LEXAPRO) 10 MG tablet, Take 10 mg by mouth daily. , Disp: , Rfl:  .  FLUoxetine (PROZAC) 40 MG capsule, Take 40 mg by mouth daily. , Disp: , Rfl:  .  Fluticasone-Salmeterol (ADVAIR) 250-50 MCG/DOSE AEPB, Inhale 1 puff into the lungs 2 (  two) times a day. , Disp: , Rfl:  .  gabapentin (NEURONTIN) 300 MG capsule, Take 300 mg by mouth 3 (three) times daily., Disp: , Rfl:  .  linaclotide (LINZESS) 72 MCG capsule, Take 72 mcg by mouth daily before breakfast. , Disp: , Rfl:  .  montelukast (SINGULAIR) 10 MG tablet, Take 10 mg by mouth at bedtime. , Disp: , Rfl:  .  Multiple Vitamins-Minerals (COMPLETE MULTIVITAMIN/MINERAL) LIQD, Take 1 tablet by mouth daily. , Disp: , Rfl:  .  prazosin (MINIPRESS) 1 MG capsule, Take 1 mg by mouth daily. , Disp: , Rfl:  .  SUMAtriptan (IMITREX) 50 MG tablet, Take 50 mg by mouth daily., Disp: , Rfl:  .  traZODone (DESYREL) 50 MG tablet, Take 1 tablet by mouth as needed., Disp: , Rfl:   No results found.  No images are attached to the encounter.   No flowsheet data found. CBC Latest Ref Rng & Units 11/03/2018  WBC 4.0 - 10.5 K/uL 3.3(L)  Hemoglobin 12.0 - 15.0 g/dL 14.4  Hematocrit 36.0 - 46.0 % 42.6  Platelets 150 - 400 K/uL 262     Observation/objective:appears in no acute distress over video visit today. Breathing is non labored.   Assessment and plan:Patient is 43 yr old female with iron deficiency anemia possibly secondary to gastric bypass   Patient did not have significant anemia to begin only evidence of iron deficiency. Her hb has however improved from 12 to 14. Iron studies have normalized. Repeat cbc ferritin and iron  studies in 3 and 6 months and I will see her back in 6 months   Follow-up instructions: as above  I discussed the assessment and treatment plan with the patient. The patient was provided an opportunity to ask questions and all were answered. The patient agreed with the plan and demonstrated an understanding of the instructions.   The patient was advised to call back or seek an in-person evaluation if the symptoms worsen or if the condition fails to improve as anticipated.   Visit Diagnosis: 1. Iron deficiency anemia, unspecified iron deficiency anemia type     Dr. Randa Evens, MD, MPH [facility] at [facility] Pager- [phone] 11/09/2018 12:41 PM

## 2019-01-03 ENCOUNTER — Ambulatory Visit: Payer: Federal, State, Local not specified - PPO | Admitting: Obstetrics and Gynecology

## 2019-01-10 ENCOUNTER — Ambulatory Visit: Payer: Federal, State, Local not specified - PPO | Admitting: Certified Nurse Midwife

## 2019-01-10 ENCOUNTER — Other Ambulatory Visit: Payer: Self-pay

## 2019-01-10 ENCOUNTER — Encounter: Payer: Self-pay | Admitting: Certified Nurse Midwife

## 2019-01-10 VITALS — BP 130/80 | HR 68 | Ht 68.0 in | Wt 226.0 lb

## 2019-01-10 DIAGNOSIS — N9489 Other specified conditions associated with female genital organs and menstrual cycle: Secondary | ICD-10-CM

## 2019-01-10 LAB — POCT WET PREP (WET MOUNT): Trichomonas Wet Prep HPF POC: ABSENT

## 2019-01-10 MED ORDER — TRIAMCINOLONE ACETONIDE 0.1 % EX OINT: 1.0000 "application " | TOPICAL_OINTMENT | Freq: Two times a day (BID) | CUTANEOUS | 0 refills | Status: DC

## 2019-01-10 NOTE — Progress Notes (Signed)
Obstetrics & Gynecology Office Visit   Chief Complaint:  Chief Complaint  Patient presents with  . 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 She denies vaginal discharge, dysuria. She was just finishing a course of Flagyl for BV that was diagnosed 1 week ago. GC/Chlamydia NAA was negative.  She also received treatment for syphilis that was diagnosed at a gyn office visit in February 2020. She [facility] 3 doses [facility]N [facility] and will be following up with them for RPR titers in 2 mos. Current form of contraception is the Paraguard IUD and she is also on Lupron for treatment of endometriosis.     Review of Systems:  ROS -see HPI  Past Medical History:  Past Medical History:  Diagnosis Date  . Anemia   . Anxiety disorder   . Asthma   . Congenital pes planus   . Endometriosis   . Endometriosis   . Essential hypertension   . Hypercholesterolemia   . IBS (irritable bowel syndrome)   . Migraine   . Muscle spasm of back   . Nerve root and plexus disorder, unspecified   . Other intervertebral disc degeneration, lumbar region   . Plantar fascial fibromatosis   . Proteinuria   . PTSD (post-traumatic stress disorder)     Past Surgical History:  Past Surgical History:  Procedure Laterality Date  . biopsy cervix single/mult/excision of lesion spx    . CYSTOURETHROSCOPY    . DILATION AND CURETTAGE OF UTERUS    . endometrial bx conjunct w/ colposcopy    . ESOPHAGOGASTRODUODENOSCOPY    . laps gastrc rstrictiv px longitudinal gastrectomy    . TONSILLECTOMY    . ureteroplasty plastic operation ureter      Gynecologic History: No LMP recorded. (Menstrual status: Other).  Obstetric History: G2P1011  Family History:  Family History   Problem Relation Age of Onset  . Leukemia Mother   . Diabetes Father   . Hypertension Father     Social History:  Social History   Socioeconomic History  . Marital status: Single    Spouse name: Not on file  . Number of children: Not on file  . Years of education: Not on file  . Highest education level: Not on file  Occupational History  . Not on file  Social Needs  . Financial resource strain: Not on file  . Food insecurity    Worry: Not on file    Inability: Not on file  . Transportation needs    Medical: Not on file    Non-medical: Not on file  Tobacco Use  . Smoking status: Never Smoker  . Smokeless tobacco: Never Used  Substance and Sexual Activity  . Alcohol use: Yes  . Drug use: Never  . Sexual activity: Yes    Birth control/protection: I.U.D.  Lifestyle  . Physical activity    Days per week: Not on file    Minutes per session: Not on file  . Stress: Not on file  Relationships  . Social Herbalist on phone: Not on file    Gets together: Not on file    Attends religious service: Not on file    Active member of club or organization:  Not on file    Attends meetings of clubs or organizations: Not on file    Relationship status: Not on file  . Intimate partner violence    Fear of current or ex partner: Not on file    Emotionally abused: Not on file    Physically abused: Not on file    Forced sexual activity: Not on file  Other Topics Concern  . Not on file  Social History Narrative  . Not on file    Allergies:  Allergies  Allergen Reactions  . Levofloxacin Hives    Other reaction(s): Unknown  . Tape     Other reaction(s): Other (See Comments) Burns the skin.     Medications: Prior to Admission medications   Medication Sig Start Date End Date Taking? Authorizing Provider  albuterol (PROVENTIL HFA;VENTOLIN HFA) 108 (90 Base) MCG/ACT inhaler Inhale 2 puffs into the lungs as needed.   Yes [provider]  ARIPiprazole (ABILIFY)  10 MG tablet Take 10 mg by mouth daily.    Yes [provider]  b complex vitamins tablet Take 1 tablet by mouth daily.    Yes [provider]  Calcium 500-100 MG-UNIT CHEW Chew 1 tablet by mouth daily.    Yes [provider]  Cyanocobalamin (VITAMIN B 12) 500 MCG TABS Take 1 tablet by mouth daily.    Yes [provider]  dicyclomine (BENTYL) 10 MG capsule Take 10 mg by mouth daily.  10/24/15  Yes [provider]  docusate sodium (COLACE) 100 MG capsule Take 100 mg by mouth daily.    Yes [provider]  EMGALITY 120 MG/ML SOAJ  12/25/18  Yes [provider]  fluticasone (FLONASE) 50 MCG/ACT nasal spray fluticasone propionate 50 mcg/actuation nasal spray,suspension   Yes [provider]  Fluticasone-Salmeterol (ADVAIR) 250-50 MCG/DOSE AEPB Inhale 1 puff into the lungs 2 (two) times a day.  01/27/18  Yes [provider]  gabapentin (NEURONTIN) 100 MG capsule gabapentin 100 mg capsule   Yes [provider]  gabapentin (NEURONTIN) 300 MG capsule Take 300 mg by mouth 3 (three) times daily.   Yes [provider]  leuprolide (LUPRON DEPOT, 64-MONTH,) 11.25 MG injection Inject into the muscle. 10/31/18 10/31/19 Yes [provider]  linaclotide (LINZESS) 72 MCG capsule Take 72 mcg by mouth daily before breakfast.    Yes [provider]  metroNIDAZOLE (FLAGYL) 500 MG tablet Take by mouth. 01/03/19 01/10/19 Yes [provider]  montelukast (SINGULAIR) 10 MG tablet Take 10 mg by mouth at bedtime.  08/20/09  Yes [provider]  Multiple Vitamins-Minerals (COMPLETE MULTIVITAMIN/MINERAL) LIQD Take 1 tablet by mouth daily.    Yes [provider]  pantoprazole (PROTONIX) 40 MG tablet  12/26/18  Yes [provider]  prazosin (MINIPRESS) 1 MG capsule Take 1 mg by mouth daily.  08/02/18  Yes [provider]  SUMAtriptan (IMITREX) 50 MG tablet Take 50 mg by mouth daily.  08/02/18  Yes [provider]  traMADol (ULTRAM) 50 MG tablet tramadol 50 mg tablet   Yes [provider]  traZODone (DESYREL) 50 MG tablet Take 1 tablet by mouth as needed. 01/08/16  Yes [provider]  triamcinolone ointment (KENALOG) 0.1 % Apply 1 application topically 2 (two) times daily. 01/10/19   Dalia Heading, CNM    Physical Exam Vitals: BP 130/80   Pulse 68   Ht 5\' 8"  (1.727 m)   Wt 226 lb (102.5 kg)   BMI 34.36 kg/m  Physical Exam  Constitutional: She is oriented to person, place, and time.  BF in NAD  Respiratory: Effort normal.  Genitourinary:    Genitourinary Comments: Vulva: no inflammation, no lesions Vagina: white mucoepithelial discharge Cervix: IUD strings at cervical os, no blood, no lesions   Musculoskeletal: Normal range of motion.  Neurological: She is alert and oriented to person, place, and time.  Skin: Skin is warm and dry.  Psychiatric: She has a normal mood and affect.   Results for orders placed or performed in visit on 01/10/19 (from the past 24 hour(s))  POCT Wet Prep [facility] [place])     Status: Normal   Collection Time: 01/10/19  9:05 PM  Result Value Ref Range   Source Wet Prep POC vagina    WBC, Wet Prep HPF POC     Bacteria Wet Prep HPF POC Few Few   BACTERIA WET PREP MORPHOLOGY POC     Clue Cells Wet Prep HPF POC None None   Clue Cells Wet Prep Whiff POC     Yeast Wet Prep HPF POC None None   KOH Wet Prep POC     Trichomonas Wet Prep HPF POC Absent Absent    Assessment: 43 y.o. G2P1011 with vulvar burning-possibly from V steam causing irritation No evidence of vaginitis/ no visible external lesions  Plan: Kenalog ointment BID for burning/irritation No underwear at home/ cool compresses as needed RTO for worsening or persistent symptoms  Dalia Heading, CNM

## 2019-02-06 ENCOUNTER — Other Ambulatory Visit: Payer: Self-pay

## 2019-02-07 ENCOUNTER — Inpatient Hospital Stay: Payer: Federal, State, Local not specified - PPO | Attending: Oncology

## 2019-05-09 NOTE — Progress Notes (Signed)
Patient is coming in for follow up she is doing well no complaints  

## 2019-05-10 ENCOUNTER — Inpatient Hospital Stay: Payer: Federal, State, Local not specified - PPO | Attending: Oncology

## 2019-05-10 ENCOUNTER — Other Ambulatory Visit: Payer: Self-pay

## 2019-05-10 ENCOUNTER — Inpatient Hospital Stay (HOSPITAL_BASED_OUTPATIENT_CLINIC_OR_DEPARTMENT_OTHER): Payer: Federal, State, Local not specified - PPO | Admitting: Oncology

## 2019-05-10 VITALS — BP 115/82 | HR 64 | Temp 98.1°F | Ht 68.0 in | Wt 223.0 lb

## 2019-05-10 DIAGNOSIS — D509 Iron deficiency anemia, unspecified: Secondary | ICD-10-CM | POA: Diagnosis not present

## 2019-05-10 DIAGNOSIS — Z9884 Bariatric surgery status: Secondary | ICD-10-CM

## 2019-05-10 LAB — CBC
HCT: 38.3 % (ref 36.0–46.0)
Hemoglobin: 12.9 g/dL (ref 12.0–15.0)
MCH: 29.5 pg (ref 26.0–34.0)
MCHC: 33.7 g/dL (ref 30.0–36.0)
MCV: 87.6 fL (ref 80.0–100.0)
Platelets: 254 10*3/uL (ref 150–400)
RBC: 4.37 MIL/uL (ref 3.87–5.11)
RDW: 12.1 % (ref 11.5–15.5)
WBC: 4.1 10*3/uL (ref 4.0–10.5)
nRBC: 0 % (ref 0.0–0.2)

## 2019-05-10 LAB — IRON AND TIBC
Iron: 80 ug/dL (ref 28–170)
Saturation Ratios: 20 % (ref 10.4–31.8)
TIBC: 401 ug/dL (ref 250–450)
UIBC: 321 ug/dL

## 2019-05-10 LAB — FERRITIN: Ferritin: 64 ng/mL (ref 11–307)

## 2019-05-11 ENCOUNTER — Encounter: Payer: Self-pay | Admitting: Oncology

## 2019-05-11 NOTE — Progress Notes (Signed)
Hematology/Oncology Consult note [facility]  [phone] [phone]  Patient Care Team: Remi Haggard, FNP as PCP - General (Family Medicine)   Name of the patient: Jacqueline Caldwell  [id number]  [birth year]   Date of visit: 05/11/19  Diagnosis-iron deficiency anemia  Chief complaint/ Reason for visit-routine follow-up of iron deficiency anemia  Heme/Onc history: patient is a 43 year old African-American female with a past medical history significant for gastric bypass surgery in 2014. She has also had history of endometriosis which affected her left kidney and she has required multiple excisions for the same. She was almost to the point of losing her left kidney but was able to get that salvaged. She has also had EGD and colonoscopy done in the past at [facility] in [facility]. Colonoscopy was normal and EGD showed 4 mm nodules in the stomach which were negative for malignancy she is currently on Lupron for endometriosis and since then she has not had her menstrual cycles. She has been referred to [place] for iron deficiency anemia. Recent blood work from 08/07/2018 showed white count of 3.5, H&H of 12.5/38.4 with an MCV of 81 and a platelet count of 266. CMP was within normal limits. Iron saturation was low at 8%. TSH was high at 4.6 with a low T3 of 23. B12 was high normal at 1590. Folate was normal. Ferritin was low at 24. Patient denies any consistent use of NSAIDs. Denies any blood in her stools or dark melanotic stools.  Patient received 2 doses of feraheme in feb 2020.   Interval history-she reports occasional fatigue which comes and goes.  Reports that she always feels cold and needs to use a heater in multiple locations.  Feels that the IV iron did help her for some time but then she was back to feeling fatigued.  ECOG PS- 0 Pain scale- 0   Review of systems- Review of Systems  Constitutional: Positive for malaise/fatigue.  Negative for chills, fever and weight loss.  HENT: Negative for congestion, ear discharge and nosebleeds.   Eyes: Negative for blurred vision.  Respiratory: Negative for cough, hemoptysis, sputum production, shortness of breath and wheezing.   Cardiovascular: Negative for chest pain, palpitations, orthopnea and claudication.  Gastrointestinal: Negative for abdominal pain, blood in stool, constipation, diarrhea, heartburn, melena, nausea and vomiting.  Genitourinary: Negative for dysuria, flank pain, frequency, hematuria and urgency.  Musculoskeletal: Negative for back pain, joint pain and myalgias.  Skin: Negative for rash.  Neurological: Negative for dizziness, tingling, focal weakness, seizures, weakness and headaches.  Endo/Heme/Allergies: Does not bruise/bleed easily.  Psychiatric/Behavioral: Negative for depression and suicidal ideas. The patient does not have insomnia.       Allergies  Allergen Reactions  . Levofloxacin Hives    Other reaction(s): Unknown  . Tape     Other reaction(s): Other (See Comments) Burns the skin.      Past Medical History:  Diagnosis Date  . Anemia   . Anxiety disorder   . Asthma   . Congenital pes planus   . Endometriosis   . Endometriosis   . Essential hypertension   . Hypercholesterolemia   . IBS (irritable bowel syndrome)   . Migraine   . Muscle spasm of back   . Nerve root and plexus disorder, unspecified   . Other intervertebral disc degeneration, lumbar region   . Plantar fascial fibromatosis   . Proteinuria   . PTSD (post-traumatic stress disorder)      Past Surgical History:  Procedure Laterality Date  . biopsy cervix single/mult/excision of lesion spx    . CYSTOURETHROSCOPY    . DILATION AND CURETTAGE OF UTERUS    . endometrial bx conjunct w/ colposcopy    . ESOPHAGOGASTRODUODENOSCOPY    . laps gastrc rstrictiv px longitudinal gastrectomy    . TONSILLECTOMY    . ureteroplasty plastic operation ureter      Social  History   Socioeconomic History  . Marital status: Single    Spouse name: Not on file  . Number of children: Not on file  . Years of education: Not on file  . Highest education level: Not on file  Occupational History  . Not on file  Social Needs  . Financial resource strain: Not on file  . Food insecurity    Worry: Not on file    Inability: Not on file  . Transportation needs    Medical: Not on file    Non-medical: Not on file  Tobacco Use  . Smoking status: Never Smoker  . Smokeless tobacco: Never Used  Substance and Sexual Activity  . Alcohol use: Yes  . Drug use: Never  . Sexual activity: Yes    Birth control/protection: I.U.D.  Lifestyle  . Physical activity    Days per week: Not on file    Minutes per session: Not on file  . Stress: Not on file  Relationships  . Social Herbalist on phone: Not on file    Gets together: Not on file    Attends religious service: Not on file    Active member of club or organization: Not on file    Attends meetings of clubs or organizations: Not on file    Relationship status: Not on file  . Intimate partner violence    Fear of current or ex partner: Not on file    Emotionally abused: Not on file    Physically abused: Not on file    Forced sexual activity: Not on file  Other Topics Concern  . Not on file  Social History Narrative  . Not on file    Family History  Problem Relation Age of Onset  . Leukemia Mother   . Diabetes Father   . Hypertension Father      Current Outpatient Medications:  .  albuterol (PROVENTIL HFA;VENTOLIN HFA) 108 (90 Base) MCG/ACT inhaler, Inhale 2 puffs into the lungs as needed., Disp: , Rfl:  .  ARIPiprazole (ABILIFY) 10 MG tablet, Take 10 mg by mouth daily. , Disp: , Rfl:  .  b complex vitamins tablet, Take 1 tablet by mouth daily. , Disp: , Rfl:  .  Calcium 500-100 MG-UNIT CHEW, Chew 1 tablet by mouth daily. , Disp: , Rfl:  .  Cyanocobalamin (VITAMIN B 12) 500 MCG TABS, Take 1  tablet by mouth daily. , Disp: , Rfl:  .  dicyclomine (BENTYL) 10 MG capsule, Take 10 mg by mouth daily. , Disp: , Rfl:  .  docusate sodium (COLACE) 100 MG capsule, Take 100 mg by mouth daily. , Disp: , Rfl:  .  EMGALITY 120 MG/ML SOAJ, , Disp: , Rfl:  .  fluticasone (FLONASE) 50 MCG/ACT nasal spray, fluticasone propionate 50 mcg/actuation nasal spray,suspension, Disp: , Rfl:  .  Fluticasone-Salmeterol (ADVAIR) 250-50 MCG/DOSE AEPB, Inhale 1 puff into the lungs 2 (two) times a day. , Disp: , Rfl:  .  gabapentin (NEURONTIN) 100 MG capsule, gabapentin 100 mg capsule, Disp: , Rfl:  .  gabapentin (NEURONTIN) 300  MG capsule, Take 300 mg by mouth 3 (three) times daily., Disp: , Rfl:  .  leuprolide (LUPRON DEPOT, 47-MONTH,) 11.25 MG injection, Inject into the muscle., Disp: , Rfl:  .  linaclotide (LINZESS) 72 MCG capsule, Take 72 mcg by mouth daily before breakfast. , Disp: , Rfl:  .  montelukast (SINGULAIR) 10 MG tablet, Take 10 mg by mouth at bedtime. , Disp: , Rfl:  .  Multiple Vitamins-Minerals (COMPLETE MULTIVITAMIN/MINERAL) LIQD, Take 1 tablet by mouth daily. , Disp: , Rfl:  .  pantoprazole (PROTONIX) 40 MG tablet, , Disp: , Rfl:  .  prazosin (MINIPRESS) 1 MG capsule, Take 1 mg by mouth daily. , Disp: , Rfl:  .  SUMAtriptan (IMITREX) 50 MG tablet, Take 50 mg by mouth daily., Disp: , Rfl:  .  traMADol (ULTRAM) 50 MG tablet, tramadol 50 mg tablet, Disp: , Rfl:  .  traZODone (DESYREL) 50 MG tablet, Take 1 tablet by mouth as needed., Disp: , Rfl:  .  triamcinolone ointment (KENALOG) 0.1 %, Apply 1 application topically 2 (two) times daily., Disp: 30 g, Rfl: 0  Physical exam:  Vitals:   05/10/19 0922  BP: 115/82  Pulse: 64  Temp: 98.1 F (36.7 C)  TempSrc: Tympanic  Weight: 223 lb (101.2 kg)  Height: 5\' 8"  (1.727 m)   Physical Exam Constitutional:      General: She is not in acute distress. HENT:     Head: Normocephalic and atraumatic.  Eyes:     Pupils: Pupils are equal, round, and  reactive to light.  Neck:     Musculoskeletal: Normal range of motion.  Cardiovascular:     Rate and Rhythm: Normal rate and regular rhythm.     Heart sounds: Normal heart sounds.  Pulmonary:     Effort: Pulmonary effort is normal.     Breath sounds: Normal breath sounds.  Abdominal:     General: Bowel sounds are normal.     Palpations: Abdomen is soft.  Skin:    General: Skin is warm and dry.  Neurological:     Mental Status: She is alert and oriented to person, place, and time.      No flowsheet data found. CBC Latest Ref Rng & Units 05/10/2019  WBC 4.0 - 10.5 K/uL 4.1  Hemoglobin 12.0 - 15.0 g/dL 12.9  Hematocrit 36.0 - 46.0 % 38.3  Platelets 150 - 400 K/uL 254      Assessment and plan- Patient is a 43 y.o. female with iron deficiency anemia possibly secondary to gastric bypass here for routine follow-up  Patient is not anemic today and her iron studies are normal.  She does not require IV iron at this time.  I will repeat her CBC ferritin and iron studies in 3 months in 6 months and see her back in 6 months   Visit Diagnosis 1. Iron deficiency anemia, unspecified iron deficiency anemia type   2. History of gastric bypass      Dr. Randa Evens, MD, MPH [facility] at [facility] [id number] 05/11/2019 8:42 AM

## 2019-08-09 ENCOUNTER — Other Ambulatory Visit: Payer: Self-pay

## 2019-08-10 ENCOUNTER — Other Ambulatory Visit: Payer: Self-pay

## 2019-08-10 ENCOUNTER — Inpatient Hospital Stay: Payer: Federal, State, Local not specified - PPO | Attending: Oncology

## 2019-08-10 ENCOUNTER — Inpatient Hospital Stay: Payer: Federal, State, Local not specified - PPO

## 2019-08-10 DIAGNOSIS — D509 Iron deficiency anemia, unspecified: Secondary | ICD-10-CM | POA: Insufficient documentation

## 2019-08-10 LAB — CBC WITH DIFFERENTIAL/PLATELET
Abs Immature Granulocytes: 0.01 10*3/uL (ref 0.00–0.07)
Basophils Absolute: 0 10*3/uL (ref 0.0–0.1)
Basophils Relative: 0 %
Eosinophils Absolute: 0 10*3/uL (ref 0.0–0.5)
Eosinophils Relative: 1 %
HCT: 40 % (ref 36.0–46.0)
Hemoglobin: 12.7 g/dL (ref 12.0–15.0)
Immature Granulocytes: 0 %
Lymphocytes Relative: 47 %
Lymphs Abs: 2.1 10*3/uL (ref 0.7–4.0)
MCH: 28.2 pg (ref 26.0–34.0)
MCHC: 31.8 g/dL (ref 30.0–36.0)
MCV: 88.7 fL (ref 80.0–100.0)
Monocytes Absolute: 0.4 10*3/uL (ref 0.1–1.0)
Monocytes Relative: 8 %
Neutro Abs: 2 10*3/uL (ref 1.7–7.7)
Neutrophils Relative %: 44 %
Platelets: 287 10*3/uL (ref 150–400)
RBC: 4.51 MIL/uL (ref 3.87–5.11)
RDW: 12.5 % (ref 11.5–15.5)
WBC: 4.5 10*3/uL (ref 4.0–10.5)
nRBC: 0 % (ref 0.0–0.2)

## 2019-08-10 LAB — FERRITIN: Ferritin: 20 ng/mL (ref 11–307)

## 2019-08-10 LAB — IRON AND TIBC
Iron: 80 ug/dL (ref 28–170)
Saturation Ratios: 17 % (ref 10.4–31.8)
TIBC: 466 ug/dL — ABNORMAL HIGH (ref 250–450)
UIBC: 386 ug/dL

## 2019-08-13 ENCOUNTER — Other Ambulatory Visit: Payer: Self-pay | Admitting: Obstetrics and Gynecology

## 2019-08-13 ENCOUNTER — Other Ambulatory Visit: Payer: Self-pay | Admitting: Family Medicine

## 2019-08-13 DIAGNOSIS — Z1231 Encounter for screening mammogram for malignant neoplasm of breast: Secondary | ICD-10-CM

## 2019-08-14 ENCOUNTER — Telehealth: Payer: Self-pay

## 2019-08-14 NOTE — Telephone Encounter (Signed)
-----   Message from Sindy Guadeloupe, MD sent at 08/13/2019 12:17 PM EST ----- She is not anemic but ferritin is lower and tibc is elevated suggestive of iron deficiency. Would recommend IV iron if she is agreeable

## 2019-08-14 NOTE — Telephone Encounter (Signed)
Called patient to let her know that after Dr. Janese Banks reviewed her lab results, she saw that her iron levels were low and that she would want her to have iron infusions. Patient agreed and stated that she would come in and have this done. I informed patient of when she was scheduled to come in. Patient agreed.

## 2019-08-16 ENCOUNTER — Other Ambulatory Visit: Payer: Self-pay | Admitting: Oncology

## 2019-08-22 ENCOUNTER — Other Ambulatory Visit: Payer: Self-pay

## 2019-08-23 ENCOUNTER — Inpatient Hospital Stay: Payer: Federal, State, Local not specified - PPO

## 2019-08-23 ENCOUNTER — Other Ambulatory Visit: Payer: Self-pay

## 2019-08-23 NOTE — Progress Notes (Signed)
After 4 unsuccessful attempts  to get an IV access on Ms. [place] for a venofer infusion, Dr. Janese Banks decided that we should hold off on giving the infusion today. Dr. Janese Banks agreed that the patient should come to her next scheduled appointment on 08/30/2019 to get Venofer and then add another Venofer treatment for the following week. I also informed the patient to drink plenty of fluids before coming for her appointment so she will have better IV access.Patient verbalizes understanding.

## 2019-08-30 ENCOUNTER — Inpatient Hospital Stay: Payer: Federal, State, Local not specified - PPO

## 2019-08-30 ENCOUNTER — Other Ambulatory Visit: Payer: Self-pay

## 2019-08-30 VITALS — BP 124/85 | HR 60 | Temp 97.2°F | Resp 18

## 2019-08-30 DIAGNOSIS — D509 Iron deficiency anemia, unspecified: Secondary | ICD-10-CM

## 2019-08-30 MED ORDER — SODIUM CHLORIDE 0.9 % IV SOLN
510.0000 mg | Freq: Once | INTRAVENOUS | Status: AC
  Administered 2019-08-30: 15:00:00 510 mg via INTRAVENOUS
  Filled 2019-08-30: qty 17

## 2019-08-30 MED ORDER — SODIUM CHLORIDE 0.9 % IV SOLN
Freq: Once | INTRAVENOUS | Status: AC
  Filled 2019-08-30: qty 250

## 2019-09-07 ENCOUNTER — Ambulatory Visit: Payer: Federal, State, Local not specified - PPO

## 2019-09-10 ENCOUNTER — Inpatient Hospital Stay: Payer: Federal, State, Local not specified - PPO | Attending: Oncology

## 2019-09-21 ENCOUNTER — Ambulatory Visit
Admission: RE | Admit: 2019-09-21 | Discharge: 2019-09-21 | Disposition: A | Discharge status: Home / Self Care | Payer: Federal, State, Local not specified - PPO | Source: Ambulatory Visit | Attending: Obstetrics and Gynecology | Admitting: Obstetrics and Gynecology

## 2019-09-21 DIAGNOSIS — Z1231 Encounter for screening mammogram for malignant neoplasm of breast: Secondary | ICD-10-CM | POA: Diagnosis not present

## 2019-11-05 ENCOUNTER — Inpatient Hospital Stay: Payer: Federal, State, Local not specified - PPO | Admitting: Oncology

## 2019-11-05 ENCOUNTER — Inpatient Hospital Stay: Payer: Federal, State, Local not specified - PPO | Attending: Oncology

## 2019-11-06 ENCOUNTER — Inpatient Hospital Stay: Payer: Federal, State, Local not specified - PPO

## 2019-11-08 ENCOUNTER — Inpatient Hospital Stay: Payer: Federal, State, Local not specified - PPO

## 2019-11-08 ENCOUNTER — Inpatient Hospital Stay: Payer: Federal, State, Local not specified - PPO | Admitting: Oncology

## 2019-12-10 ENCOUNTER — Other Ambulatory Visit: Payer: Self-pay

## 2019-12-10 ENCOUNTER — Inpatient Hospital Stay: Payer: Federal, State, Local not specified - PPO | Attending: Oncology

## 2019-12-10 DIAGNOSIS — D509 Iron deficiency anemia, unspecified: Secondary | ICD-10-CM | POA: Diagnosis not present

## 2019-12-10 LAB — CBC WITH DIFFERENTIAL/PLATELET
Abs Immature Granulocytes: 0.01 10*3/uL (ref 0.00–0.07)
Basophils Absolute: 0 10*3/uL (ref 0.0–0.1)
Basophils Relative: 1 %
Eosinophils Absolute: 0.1 10*3/uL (ref 0.0–0.5)
Eosinophils Relative: 1 %
HCT: 38.4 % (ref 36.0–46.0)
Hemoglobin: 13.6 g/dL (ref 12.0–15.0)
Immature Granulocytes: 0 %
Lymphocytes Relative: 50 %
Lymphs Abs: 2.4 10*3/uL (ref 0.7–4.0)
MCH: 30.2 pg (ref 26.0–34.0)
MCHC: 35.4 g/dL (ref 30.0–36.0)
MCV: 85.1 fL (ref 80.0–100.0)
Monocytes Absolute: 0.3 10*3/uL (ref 0.1–1.0)
Monocytes Relative: 6 %
Neutro Abs: 2 10*3/uL (ref 1.7–7.7)
Neutrophils Relative %: 42 %
Platelets: 336 10*3/uL (ref 150–400)
RBC: 4.51 MIL/uL (ref 3.87–5.11)
RDW: 12.6 % (ref 11.5–15.5)
WBC: 4.8 10*3/uL (ref 4.0–10.5)
nRBC: 0 % (ref 0.0–0.2)

## 2019-12-10 LAB — IRON AND TIBC
Iron: 139 ug/dL (ref 28–170)
Saturation Ratios: 33 % — ABNORMAL HIGH (ref 10.4–31.8)
TIBC: 426 ug/dL (ref 250–450)
UIBC: 287 ug/dL

## 2019-12-10 LAB — FERRITIN: Ferritin: 52 ng/mL (ref 11–307)

## 2019-12-11 ENCOUNTER — Inpatient Hospital Stay (HOSPITAL_BASED_OUTPATIENT_CLINIC_OR_DEPARTMENT_OTHER): Payer: Federal, State, Local not specified - PPO | Admitting: Oncology

## 2019-12-11 VITALS — BP 131/107 | HR 75 | Temp 96.4°F | Resp 10 | Wt 221.5 lb

## 2019-12-11 DIAGNOSIS — D509 Iron deficiency anemia, unspecified: Secondary | ICD-10-CM | POA: Diagnosis not present

## 2019-12-11 DIAGNOSIS — R6889 Other general symptoms and signs: Secondary | ICD-10-CM | POA: Diagnosis not present

## 2019-12-14 NOTE — Progress Notes (Signed)
Hematology/Oncology Consult note [facility]  [phone] [phone]  Patient Care Team: Remi Haggard, FNP as PCP - General (Family Medicine)   Name of the patient: Jacqueline Caldwell  [id number]  [birth year]   Date of visit: 12/14/19  Diagnosis- iron deficiency anemia  Chief complaint/ Reason for visit-routine follow-up of iron deficiency anemia  Heme/Onc history: patient is a 44 year old African-American female with a past medical history significant for gastric bypass surgery in 2014. She has also had history of endometriosis which affected her left kidney and she has required multiple excisions for the same. She was almost to the point of losing her left kidney but was able to get that salvaged. She has also had EGD and colonoscopy done in the past at [facility] in 2018. Colonoscopy was normal and EGD showed 4 mm nodules in the stomach which were negative for malignancy she is currently on Lupron for endometriosis and since then she has not had her menstrual cycles. She has been referred to [place] for iron deficiency anemia. Recent blood work from 08/07/2018 showed white count of 3.5, H&H of 12.5/38.4 with an MCV of 81 and a platelet count of 266. CMP was within normal limits. Iron saturation was low at 8%. TSH was high at 4.6 with a low T3 of 23. B12 was high normal at 1590. Folate was normal. Ferritin was low at 24. Patient denies any consistent use of NSAIDs. Denies any blood in her stools or dark melanotic stools.  Patient received 2 doses of feraheme in feb 2020.   Interval history-patient reports ongoing fatigue.  Also reports feeling excessively cold when others feel hot around her.  She always has to wear a jacket even in summer weather.  Denies other complaints at this time  ECOG PS- 0 Pain scale- 0   Review of systems- Review of Systems  Constitutional: Positive for malaise/fatigue. Negative for chills, fever and  weight loss.  HENT: Negative for congestion, ear discharge and nosebleeds.   Eyes: Negative for blurred vision.  Respiratory: Negative for cough, hemoptysis, sputum production, shortness of breath and wheezing.   Cardiovascular: Negative for chest pain, palpitations, orthopnea and claudication.  Gastrointestinal: Negative for abdominal pain, blood in stool, constipation, diarrhea, heartburn, melena, nausea and vomiting.  Genitourinary: Negative for dysuria, flank pain, frequency, hematuria and urgency.  Musculoskeletal: Negative for back pain, joint pain and myalgias.  Skin: Negative for rash.  Neurological: Negative for dizziness, tingling, focal weakness, seizures, weakness and headaches.  Endo/Heme/Allergies: Does not bruise/bleed easily.  Psychiatric/Behavioral: Negative for depression and suicidal ideas. The patient does not have insomnia.        Allergies  Allergen Reactions  . Levofloxacin Hives    Other reaction(s): Unknown  . Tape     Other reaction(s): Other (See Comments) Burns the skin.      Past Medical History:  Diagnosis Date  . Anemia   . Anxiety disorder   . Asthma   . Congenital pes planus   . Endometriosis   . Endometriosis   . Essential hypertension   . Hypercholesterolemia   . IBS (irritable bowel syndrome)   . Migraine   . Muscle spasm of back   . Nerve root and plexus disorder, unspecified   . Other intervertebral disc degeneration, lumbar region   . Plantar fascial fibromatosis   . Proteinuria   . PTSD (post-traumatic stress disorder)      Past Surgical History:  Procedure Laterality Date  . biopsy  cervix single/mult/excision of lesion spx    . CYSTOURETHROSCOPY    . DILATION AND CURETTAGE OF UTERUS    . endometrial bx conjunct w/ colposcopy    . ESOPHAGOGASTRODUODENOSCOPY    . laps gastrc rstrictiv px longitudinal gastrectomy    . TONSILLECTOMY    . ureteroplasty plastic operation ureter      Social History   Socioeconomic History    . Marital status: Single    Spouse name: Not on file  . Number of children: Not on file  . Years of education: Not on file  . Highest education level: Not on file  Occupational History  . Not on file  Tobacco Use  . Smoking status: Never Smoker  . Smokeless tobacco: Never Used  Vaping Use  . Vaping Use: Never used  Substance and Sexual Activity  . Alcohol use: Yes  . Drug use: Never  . Sexual activity: Yes    Birth control/protection: I.U.D.  Other Topics Concern  . Not on file  Social History Narrative  . Not on file   Social Determinants of Health   Financial Resource Strain:   . Difficulty of Paying Living Expenses:   Food Insecurity:   . Worried About Charity fundraiser in the Last Year:   . Arboriculturist in the Last Year:   Transportation Needs:   . Film/video editor (Medical):   Marland Kitchen Lack of Transportation (Non-Medical):   Physical Activity:   . Days of Exercise per Week:   . Minutes of Exercise per Session:   Stress:   . Feeling of Stress :   Social Connections:   . Frequency of Communication with Friends and Family:   . Frequency of Social Gatherings with Friends and Family:   . Attends Religious Services:   . Active Member of Clubs or Organizations:   . Attends Archivist Meetings:   Marland Kitchen Marital Status:   Intimate Partner Violence:   . Fear of Current or Ex-Partner:   . Emotionally Abused:   Marland Kitchen Physically Abused:   . Sexually Abused:     Family History  Problem Relation Age of Onset  . Leukemia Mother   . Diabetes Father   . Hypertension Father      Current Outpatient Medications:  .  albuterol (PROVENTIL HFA;VENTOLIN HFA) 108 (90 Base) MCG/ACT inhaler, Inhale 2 puffs into the lungs as needed., Disp: , Rfl:  .  ARIPiprazole (ABILIFY) 10 MG tablet, Take 10 mg by mouth daily. , Disp: , Rfl:  .  b complex vitamins tablet, Take 1 tablet by mouth daily. , Disp: , Rfl:  .  Calcium 500-100 MG-UNIT CHEW, Chew 1 tablet by mouth daily. ,  Disp: , Rfl:  .  docusate sodium (COLACE) 100 MG capsule, Take 100 mg by mouth daily. , Disp: , Rfl:  .  EMGALITY 120 MG/ML SOAJ, , Disp: , Rfl:  .  fluticasone (FLONASE) 50 MCG/ACT nasal spray, fluticasone propionate 50 mcg/actuation nasal spray,suspension, Disp: , Rfl:  .  Fluticasone-Salmeterol (ADVAIR) 250-50 MCG/DOSE AEPB, Inhale 1 puff into the lungs 2 (two) times a day. , Disp: , Rfl:  .  gabapentin (NEURONTIN) 100 MG capsule, gabapentin 100 mg capsule, Disp: , Rfl:  .  gabapentin (NEURONTIN) 300 MG capsule, Take 300 mg by mouth 3 (three) times daily., Disp: , Rfl:  .  linaclotide (LINZESS) 72 MCG capsule, Take 72 mcg by mouth daily before breakfast. , Disp: , Rfl:  .  montelukast (SINGULAIR) 10  MG tablet, Take 10 mg by mouth at bedtime. , Disp: , Rfl:  .  Multiple Vitamins-Minerals (COMPLETE MULTIVITAMIN/MINERAL) LIQD, Take 1 tablet by mouth daily. , Disp: , Rfl:  .  prazosin (MINIPRESS) 1 MG capsule, Take 1 mg by mouth daily. , Disp: , Rfl:  .  SUMAtriptan (IMITREX) 50 MG tablet, Take 50 mg by mouth daily., Disp: , Rfl:  .  traMADol (ULTRAM) 50 MG tablet, tramadol 50 mg tablet, Disp: , Rfl:  .  traZODone (DESYREL) 50 MG tablet, Take 1 tablet by mouth as needed., Disp: , Rfl:  .  pantoprazole (PROTONIX) 40 MG tablet, , Disp: , Rfl:  .  triamcinolone ointment (KENALOG) 0.1 %, Apply 1 application topically 2 (two) times daily., Disp: 30 g, Rfl: 0  Physical exam:  Vitals:   12/11/19 1311  BP: (!) 131/107  Pulse: 75  Resp: 10  Temp: (!) 96.4 F (35.8 C)  TempSrc: Tympanic  SpO2: 100%  Weight: 221 lb 8 oz (100.5 kg)   Physical Exam Constitutional:      General: She is not in acute distress. Pulmonary:     Effort: Pulmonary effort is normal.  Skin:    General: Skin is warm and dry.  Neurological:     Mental Status: She is alert and oriented to person, place, and time.      No flowsheet data found. CBC Latest Ref Rng & Units 12/10/2019  WBC 4.0 - 10.5 K/uL 4.8  Hemoglobin  12.0 - 15.0 g/dL 13.6  Hematocrit 36 - 46 % 38.4  Platelets 150 - 400 K/uL 336    Assessment and plan- Patient is a 44 y.o. female with iron deficiency anemia here for routine follow-up  1.  Iron deficiency anemia: Patient is presently not anemic.  HerIron studies are normal and she does not require any IV iron at this time.  I will continue to monitor her iron studies every 3 months given her history of gastric bypass.  I will see her back in 6 months  2.  Cold intolerance: I will check her TSH in 3 months.  I have asked her to discuss this further with her primary care doctor since this does not appear to be related to her anemia.   Visit Diagnosis 1. Iron deficiency anemia, unspecified iron deficiency anemia type   2. Cold intolerance      Dr. Randa Evens, MD, MPH [facility] at [facility] [phone] 12/14/2019 9:03 AM

## 2020-03-12 ENCOUNTER — Inpatient Hospital Stay: Payer: Federal, State, Local not specified - PPO | Attending: Oncology

## 2020-06-10 ENCOUNTER — Telehealth: Payer: Self-pay | Admitting: Oncology

## 2020-06-10 NOTE — Telephone Encounter (Signed)
06/10/2020 Pt called and left VM w/ questions about her upcoming appts. I returned the call but could not reach pt. Left VM clarifying; appt for labs on 12/8 @ 2pm, then virtual MD appt on 12/9 @ 2:15pm. Left callback number in case there were any further questions or concerns  SRW

## 2020-06-11 ENCOUNTER — Other Ambulatory Visit: Payer: Self-pay

## 2020-06-11 ENCOUNTER — Inpatient Hospital Stay: Payer: Federal, State, Local not specified - PPO | Attending: Oncology

## 2020-06-11 DIAGNOSIS — Z806 Family history of leukemia: Secondary | ICD-10-CM | POA: Diagnosis not present

## 2020-06-11 DIAGNOSIS — Z9884 Bariatric surgery status: Secondary | ICD-10-CM | POA: Insufficient documentation

## 2020-06-11 DIAGNOSIS — N911 Secondary amenorrhea: Secondary | ICD-10-CM | POA: Diagnosis not present

## 2020-06-11 DIAGNOSIS — Z8249 Family history of ischemic heart disease and other diseases of the circulatory system: Secondary | ICD-10-CM | POA: Insufficient documentation

## 2020-06-11 DIAGNOSIS — Z87898 Personal history of other specified conditions: Secondary | ICD-10-CM | POA: Diagnosis not present

## 2020-06-11 DIAGNOSIS — Z862 Personal history of diseases of the blood and blood-forming organs and certain disorders involving the immune mechanism: Secondary | ICD-10-CM | POA: Diagnosis not present

## 2020-06-11 DIAGNOSIS — D509 Iron deficiency anemia, unspecified: Secondary | ICD-10-CM | POA: Diagnosis not present

## 2020-06-11 DIAGNOSIS — N800 Endometriosis of uterus: Secondary | ICD-10-CM | POA: Insufficient documentation

## 2020-06-11 DIAGNOSIS — Z79899 Other long term (current) drug therapy: Secondary | ICD-10-CM | POA: Insufficient documentation

## 2020-06-11 DIAGNOSIS — Z833 Family history of diabetes mellitus: Secondary | ICD-10-CM | POA: Insufficient documentation

## 2020-06-11 DIAGNOSIS — Z975 Presence of (intrauterine) contraceptive device: Secondary | ICD-10-CM | POA: Diagnosis not present

## 2020-06-11 DIAGNOSIS — E611 Iron deficiency: Secondary | ICD-10-CM | POA: Diagnosis not present

## 2020-06-11 DIAGNOSIS — R6889 Other general symptoms and signs: Secondary | ICD-10-CM

## 2020-06-11 LAB — CBC WITH DIFFERENTIAL/PLATELET
Abs Immature Granulocytes: 0.01 10*3/uL (ref 0.00–0.07)
Basophils Absolute: 0 10*3/uL (ref 0.0–0.1)
Basophils Relative: 0 %
Eosinophils Absolute: 0.1 10*3/uL (ref 0.0–0.5)
Eosinophils Relative: 1 %
HCT: 39.6 % (ref 36.0–46.0)
Hemoglobin: 13.6 g/dL (ref 12.0–15.0)
Immature Granulocytes: 0 %
Lymphocytes Relative: 49 %
Lymphs Abs: 2.2 10*3/uL (ref 0.7–4.0)
MCH: 29.8 pg (ref 26.0–34.0)
MCHC: 34.3 g/dL (ref 30.0–36.0)
MCV: 86.7 fL (ref 80.0–100.0)
Monocytes Absolute: 0.3 10*3/uL (ref 0.1–1.0)
Monocytes Relative: 8 %
Neutro Abs: 1.9 10*3/uL (ref 1.7–7.7)
Neutrophils Relative %: 42 %
Platelets: 272 10*3/uL (ref 150–400)
RBC: 4.57 MIL/uL (ref 3.87–5.11)
RDW: 12.1 % (ref 11.5–15.5)
WBC: 4.5 10*3/uL (ref 4.0–10.5)
nRBC: 0 % (ref 0.0–0.2)

## 2020-06-11 LAB — FERRITIN: Ferritin: 12 ng/mL (ref 11–307)

## 2020-06-11 LAB — IRON AND TIBC
Iron: 71 ug/dL (ref 28–170)
Saturation Ratios: 15 % (ref 10.4–31.8)
TIBC: 486 ug/dL — ABNORMAL HIGH (ref 250–450)
UIBC: 415 ug/dL

## 2020-06-11 LAB — TSH: TSH: 1.715 u[IU]/mL (ref 0.350–4.500)

## 2020-06-12 ENCOUNTER — Inpatient Hospital Stay (HOSPITAL_BASED_OUTPATIENT_CLINIC_OR_DEPARTMENT_OTHER): Payer: Federal, State, Local not specified - PPO | Admitting: Oncology

## 2020-06-12 DIAGNOSIS — D509 Iron deficiency anemia, unspecified: Secondary | ICD-10-CM | POA: Diagnosis not present

## 2020-06-13 ENCOUNTER — Inpatient Hospital Stay: Payer: Federal, State, Local not specified - PPO

## 2020-06-13 NOTE — Progress Notes (Signed)
I connected with Jacqueline Caldwell on 06/13/20 at  2:15 PM EST by video enabled telemedicine visit and verified that I am speaking with the correct person using two identifiers.   I discussed the limitations, risks, security and privacy concerns of performing an evaluation and management service by telemedicine and the availability of in-person appointments. I also discussed with the patient that there may be a patient responsible charge related to this service. The patient expressed understanding and agreed to proceed.  There were problems during video visit due to which it was switched to a telephone call  Other persons participating in the visit and their role in the encounter:  none  Patient's location:  home Provider's location:  work  Risk analyst Complaint: Routine follow-up of iron deficiency anemia  History of present illness: patient is a 44 year old African-American female with a past medical history significant for gastric bypass surgery in 2014. She has also had history of endometriosis which affected her left kidney and she has required multiple excisions for the same. She was almost to the point of losing her left kidney but was able to get that salvaged. She has also had EGD and colonoscopy done in the past at [facility] in 2018. Colonoscopy was normal and EGD showed 4 mm nodules in the stomach which were negative for malignancy she is currently on Lupron for endometriosis and since then she has not had her menstrual cycles. She has been referred to [place] for iron deficiency anemia. Recent blood work from 08/07/2018 showed white count of 3.5, H&H of 12.5/38.4 with an MCV of 81 and a platelet count of 266. CMP was within normal limits. Iron saturation was low at 8%. TSH was high at 4.6 with a low T3 of 23. B12 was high normal at 1590. Folate was normal. Ferritin was low at 24. Patient denies any consistent use of NSAIDs. Denies any blood in her stools or dark melanotic stools.  Patient  received 2 doses of feraheme in feb 2020.   Interval history: She reports ongoing fatigue.  Also reports feeling cold all the time which she feels when she is typically running low in her iron.   Review of Systems  Constitutional: Positive for malaise/fatigue. Negative for chills, fever and weight loss.  HENT: Negative for congestion, ear discharge and nosebleeds.   Eyes: Negative for blurred vision.  Respiratory: Negative for cough, hemoptysis, sputum production, shortness of breath and wheezing.   Cardiovascular: Negative for chest pain, palpitations, orthopnea and claudication.  Gastrointestinal: Negative for abdominal pain, blood in stool, constipation, diarrhea, heartburn, melena, nausea and vomiting.  Genitourinary: Negative for dysuria, flank pain, frequency, hematuria and urgency.  Musculoskeletal: Negative for back pain, joint pain and myalgias.  Skin: Negative for rash.  Neurological: Negative for dizziness, tingling, focal weakness, seizures, weakness and headaches.  Endo/Heme/Allergies: Does not bruise/bleed easily.       Cold intolerance  Psychiatric/Behavioral: Negative for depression and suicidal ideas. The patient does not have insomnia.     Allergies  Allergen Reactions  . Levofloxacin Hives    Other reaction(s): Unknown  . Tape     Other reaction(s): Other (See Comments) Burns the skin.     Past Medical History:  Diagnosis Date  . Anemia   . Anxiety disorder   . Asthma   . Congenital pes planus   . Endometriosis   . Endometriosis   . Essential hypertension   . Hypercholesterolemia   . IBS (irritable bowel syndrome)   . Migraine   .  Muscle spasm of back   . Nerve root and plexus disorder, unspecified   . Other intervertebral disc degeneration, lumbar region   . Plantar fascial fibromatosis   . Proteinuria   . PTSD (post-traumatic stress disorder)     Past Surgical History:  Procedure Laterality Date  . biopsy cervix single/mult/excision of lesion  spx    . CYSTOURETHROSCOPY    . DILATION AND CURETTAGE OF UTERUS    . endometrial bx conjunct w/ colposcopy    . ESOPHAGOGASTRODUODENOSCOPY    . laps gastrc rstrictiv px longitudinal gastrectomy    . TONSILLECTOMY    . ureteroplasty plastic operation ureter      Social History   Socioeconomic History  . Marital status: Single    Spouse name: Not on file  . Number of children: Not on file  . Years of education: Not on file  . Highest education level: Not on file  Occupational History  . Not on file  Tobacco Use  . Smoking status: Never Smoker  . Smokeless tobacco: Never Used  Vaping Use  . Vaping Use: Never used  Substance and Sexual Activity  . Alcohol use: Yes  . Drug use: Never  . Sexual activity: Yes    Birth control/protection: I.U.D.  Other Topics Concern  . Not on file  Social History Narrative  . Not on file   Social Determinants of Health   Financial Resource Strain: Not on file  Food Insecurity: Not on file  Transportation Needs: Not on file  Physical Activity: Not on file  Stress: Not on file  Social Connections: Not on file  Intimate Partner Violence: Not on file    Family History  Problem Relation Age of Onset  . Leukemia Mother   . Diabetes Father   . Hypertension Father      Current Outpatient Medications:  .  albuterol (PROVENTIL HFA;VENTOLIN HFA) 108 (90 Base) MCG/ACT inhaler, Inhale 2 puffs into the lungs as needed., Disp: , Rfl:  .  ARIPiprazole (ABILIFY) 10 MG tablet, Take 10 mg by mouth daily. , Disp: , Rfl:  .  b complex vitamins tablet, Take 1 tablet by mouth daily. , Disp: , Rfl:  .  Calcium 500-100 MG-UNIT CHEW, Chew 1 tablet by mouth daily. , Disp: , Rfl:  .  docusate sodium (COLACE) 100 MG capsule, Take 100 mg by mouth daily. , Disp: , Rfl:  .  EMGALITY 120 MG/ML SOAJ, , Disp: , Rfl:  .  fluticasone (FLONASE) 50 MCG/ACT nasal spray, fluticasone propionate 50 mcg/actuation nasal spray,suspension, Disp: , Rfl:  .   Fluticasone-Salmeterol (ADVAIR) 250-50 MCG/DOSE AEPB, Inhale 1 puff into the lungs 2 (two) times a day. , Disp: , Rfl:  .  gabapentin (NEURONTIN) 100 MG capsule, gabapentin 100 mg capsule, Disp: , Rfl:  .  gabapentin (NEURONTIN) 300 MG capsule, Take 300 mg by mouth 3 (three) times daily., Disp: , Rfl:  .  linaclotide (LINZESS) 72 MCG capsule, Take 72 mcg by mouth daily before breakfast. , Disp: , Rfl:  .  montelukast (SINGULAIR) 10 MG tablet, Take 10 mg by mouth at bedtime. , Disp: , Rfl:  .  Multiple Vitamins-Minerals (COMPLETE MULTIVITAMIN/MINERAL) LIQD, Take 1 tablet by mouth daily. , Disp: , Rfl:  .  pantoprazole (PROTONIX) 40 MG tablet, , Disp: , Rfl:  .  prazosin (MINIPRESS) 1 MG capsule, Take 1 mg by mouth daily. , Disp: , Rfl:  .  SUMAtriptan (IMITREX) 50 MG tablet, Take 50 mg by mouth daily.,  Disp: , Rfl:  .  traMADol (ULTRAM) 50 MG tablet, tramadol 50 mg tablet, Disp: , Rfl:  .  traZODone (DESYREL) 50 MG tablet, Take 1 tablet by mouth as needed., Disp: , Rfl:  .  triamcinolone ointment (KENALOG) 0.1 %, Apply 1 application topically 2 (two) times daily., Disp: 30 g, Rfl: 0  No results found.  No images are attached to the encounter.   No flowsheet data found. CBC Latest Ref Rng & Units 06/11/2020  WBC 4.0 - 10.5 K/uL 4.5  Hemoglobin 12.0 - 15.0 g/dL 13.6  Hematocrit 36.0 - 46.0 % 39.6  Platelets 150 - 400 K/uL 272    Assessment and plan: Patient is a 44 year old female with history of iron deficiency anemia and this is a routine follow-up visit  Iron deficiency anemia: Patient is presently not anemic but her ferritin levels are running low At 12 with an elevated TIBC of 486.  TSH is normal.  She will therefore benefit from IV iron.  Patient is willing to come for 5 doses of Venofer which is what her insurance will approve.  Repeat CBC ferritin and iron studies in 3 in 6 months and I will see her back in 6 months  Follow-up instructions: As above  I discussed the assessment and  treatment plan with the patient. The patient was provided an opportunity to ask questions and all were answered. The patient agreed with the plan and demonstrated an understanding of the instructions.   The patient was advised to call back or seek an in-person evaluation if the symptoms worsen or if the condition fails to improve as anticipated.   Visit Diagnosis: 1. Iron deficiency anemia, unspecified iron deficiency anemia type     Dr. Randa Evens, MD, MPH [facility] at [facility] Tel- [phone] 06/13/2020 12:41 PM

## 2020-06-16 ENCOUNTER — Inpatient Hospital Stay: Payer: Federal, State, Local not specified - PPO

## 2020-06-16 ENCOUNTER — Other Ambulatory Visit: Payer: Self-pay | Admitting: Oncology

## 2020-06-16 VITALS — BP 139/89 | HR 83 | Temp 98.1°F | Resp 16

## 2020-06-16 DIAGNOSIS — D509 Iron deficiency anemia, unspecified: Secondary | ICD-10-CM

## 2020-06-16 DIAGNOSIS — E611 Iron deficiency: Secondary | ICD-10-CM | POA: Diagnosis not present

## 2020-06-16 MED ORDER — SODIUM CHLORIDE 0.9 % IV SOLN: 200.0000 mg | INTRAVENOUS | Status: DC

## 2020-06-16 MED ORDER — SODIUM CHLORIDE 0.9 % IV SOLN
Freq: Once | INTRAVENOUS | Status: AC
  Filled 2020-06-16: qty 250

## 2020-06-16 MED ORDER — SODIUM CHLORIDE 0.9 % IV SOLN
510.0000 mg | Freq: Once | INTRAVENOUS | Status: DC
  Filled 2020-06-16: qty 17

## 2020-06-16 MED ORDER — IRON SUCROSE 20 MG/ML IV SOLN
200.0000 mg | Freq: Once | INTRAVENOUS | Status: AC
  Administered 2020-06-16: 15:00:00 200 mg via INTRAVENOUS
  Filled 2020-06-16: qty 10

## 2020-06-16 NOTE — Progress Notes (Signed)
Pateint was given Venofer for the first time today. She tolerated infusion well without any complications.

## 2020-06-20 ENCOUNTER — Ambulatory Visit: Payer: Federal, State, Local not specified - PPO

## 2020-06-20 ENCOUNTER — Inpatient Hospital Stay: Payer: Federal, State, Local not specified - PPO

## 2020-06-20 ENCOUNTER — Other Ambulatory Visit: Payer: Self-pay

## 2020-06-20 VITALS — BP 124/87 | HR 87 | Temp 98.0°F | Resp 18

## 2020-06-20 DIAGNOSIS — D509 Iron deficiency anemia, unspecified: Secondary | ICD-10-CM | POA: Diagnosis not present

## 2020-06-20 DIAGNOSIS — E611 Iron deficiency: Secondary | ICD-10-CM | POA: Diagnosis not present

## 2020-06-20 MED ORDER — SODIUM CHLORIDE 0.9 % IV SOLN
Freq: Once | INTRAVENOUS | Status: AC
  Filled 2020-06-20: qty 250

## 2020-06-20 MED ORDER — SODIUM CHLORIDE 0.9 % IV SOLN: 200.0000 mg | INTRAVENOUS | Status: DC

## 2020-06-20 MED ORDER — IRON SUCROSE 20 MG/ML IV SOLN
200.0000 mg | Freq: Once | INTRAVENOUS | Status: AC
  Administered 2020-06-20: 13:00:00 200 mg via INTRAVENOUS
  Filled 2020-06-20: qty 10

## 2020-06-20 NOTE — Progress Notes (Signed)
Patient received prescribed treatment in clinic. Tolerated well. Patient stable at discharge. 

## 2020-06-23 ENCOUNTER — Inpatient Hospital Stay: Payer: Federal, State, Local not specified - PPO

## 2020-06-23 VITALS — BP 128/81 | HR 71 | Temp 98.8°F | Resp 18

## 2020-06-23 DIAGNOSIS — D509 Iron deficiency anemia, unspecified: Secondary | ICD-10-CM

## 2020-06-23 DIAGNOSIS — E611 Iron deficiency: Secondary | ICD-10-CM | POA: Diagnosis not present

## 2020-06-23 MED ORDER — SODIUM CHLORIDE 0.9 % IV SOLN
Freq: Once | INTRAVENOUS | Status: AC
  Filled 2020-06-23: qty 250

## 2020-06-23 MED ORDER — SODIUM CHLORIDE 0.9 % IV SOLN: 200.0000 mg | INTRAVENOUS | Status: DC

## 2020-06-23 MED ORDER — IRON SUCROSE 20 MG/ML IV SOLN
200.0000 mg | Freq: Once | INTRAVENOUS | Status: AC
  Administered 2020-06-23: 15:00:00 200 mg via INTRAVENOUS
  Filled 2020-06-23: qty 10

## 2020-06-23 NOTE — Progress Notes (Signed)
1513- Patient tolerated Venofer infusion well. Patient and vital signs stable. Patient discharged to home at this time.

## 2020-06-26 ENCOUNTER — Ambulatory Visit: Payer: Federal, State, Local not specified - PPO

## 2020-07-01 ENCOUNTER — Inpatient Hospital Stay: Payer: Federal, State, Local not specified - PPO

## 2020-07-08 ENCOUNTER — Other Ambulatory Visit: Payer: Self-pay

## 2020-07-08 ENCOUNTER — Inpatient Hospital Stay: Payer: Federal, State, Local not specified - PPO | Attending: Oncology

## 2020-07-08 VITALS — BP 144/84 | HR 83 | Temp 97.0°F | Resp 18

## 2020-07-08 DIAGNOSIS — D509 Iron deficiency anemia, unspecified: Secondary | ICD-10-CM | POA: Diagnosis present

## 2020-07-08 MED ORDER — IRON SUCROSE 20 MG/ML IV SOLN: 200.0000 mg | Freq: Once | INTRAVENOUS | Status: DC

## 2020-07-08 MED ORDER — SODIUM CHLORIDE 0.9 % IV SOLN
Freq: Once | INTRAVENOUS | Status: AC
  Filled 2020-07-08: qty 250

## 2020-07-08 MED ORDER — IRON SUCROSE 20 MG/ML IV SOLN
200.0000 mg | Freq: Once | INTRAVENOUS | Status: AC
  Administered 2020-07-08: 200 mg via INTRAVENOUS
  Filled 2020-07-08: qty 10

## 2020-07-08 MED ORDER — SODIUM CHLORIDE 0.9 % IV SOLN: 200.0000 mg | INTRAVENOUS | Status: DC

## 2020-07-08 NOTE — Progress Notes (Signed)
Pt tolerated infusion well with no complications. VSS. Pt stable for discharge.   Jacqueline Caldwell  

## 2020-07-15 ENCOUNTER — Inpatient Hospital Stay: Payer: Federal, State, Local not specified - PPO

## 2020-09-11 ENCOUNTER — Inpatient Hospital Stay: Payer: Federal, State, Local not specified - PPO | Attending: Oncology

## 2020-12-11 ENCOUNTER — Other Ambulatory Visit: Payer: Federal, State, Local not specified - PPO

## 2020-12-11 ENCOUNTER — Ambulatory Visit: Payer: Federal, State, Local not specified - PPO | Admitting: Oncology

## 2021-01-02 ENCOUNTER — Other Ambulatory Visit: Payer: Self-pay

## 2021-01-02 ENCOUNTER — Inpatient Hospital Stay: Payer: Federal, State, Local not specified - PPO | Attending: Oncology

## 2021-01-02 ENCOUNTER — Encounter: Payer: Self-pay | Admitting: Oncology

## 2021-01-02 ENCOUNTER — Inpatient Hospital Stay (HOSPITAL_BASED_OUTPATIENT_CLINIC_OR_DEPARTMENT_OTHER): Payer: Federal, State, Local not specified - PPO | Admitting: Oncology

## 2021-01-02 VITALS — BP 120/93 | HR 63 | Temp 96.4°F | Wt 225.0 lb

## 2021-01-02 DIAGNOSIS — D509 Iron deficiency anemia, unspecified: Secondary | ICD-10-CM | POA: Insufficient documentation

## 2021-01-02 LAB — CBC WITH DIFFERENTIAL/PLATELET
Abs Immature Granulocytes: 0.01 10*3/uL (ref 0.00–0.07)
Basophils Absolute: 0 10*3/uL (ref 0.0–0.1)
Basophils Relative: 0 %
Eosinophils Absolute: 0 10*3/uL (ref 0.0–0.5)
Eosinophils Relative: 1 %
HCT: 42.8 % (ref 36.0–46.0)
Hemoglobin: 14.9 g/dL (ref 12.0–15.0)
Immature Granulocytes: 0 %
Lymphocytes Relative: 45 %
Lymphs Abs: 2.2 10*3/uL (ref 0.7–4.0)
MCH: 30.5 pg (ref 26.0–34.0)
MCHC: 34.8 g/dL (ref 30.0–36.0)
MCV: 87.7 fL (ref 80.0–100.0)
Monocytes Absolute: 0.4 10*3/uL (ref 0.1–1.0)
Monocytes Relative: 9 %
Neutro Abs: 2.2 10*3/uL (ref 1.7–7.7)
Neutrophils Relative %: 45 %
Platelets: 270 10*3/uL (ref 150–400)
RBC: 4.88 MIL/uL (ref 3.87–5.11)
RDW: 12.1 % (ref 11.5–15.5)
WBC: 4.9 10*3/uL (ref 4.0–10.5)
nRBC: 0 % (ref 0.0–0.2)

## 2021-01-02 LAB — IRON AND TIBC
Iron: 149 ug/dL (ref 28–170)
Saturation Ratios: 35 % — ABNORMAL HIGH (ref 10.4–31.8)
TIBC: 421 ug/dL (ref 250–450)
UIBC: 272 ug/dL

## 2021-01-02 LAB — FERRITIN: Ferritin: 27 ng/mL (ref 11–307)

## 2021-01-02 NOTE — Progress Notes (Signed)
Patient here for oncology follow-up appointment, expresses no complaints or concerns at this time.    

## 2021-01-02 NOTE — Progress Notes (Signed)
Hematology/Oncology Consult note [facility]  [phone] [phone]  Patient Care Team: Remi Haggard, FNP as PCP - General (Family Medicine)   Name of the patient: Jacqueline Caldwell  [id number]  January 28, 1976   Date of visit: 01/02/21  Diagnosis-iron deficiency anemia  Chief complaint/ Reason for visit-routine follow-up of iron deficiency anemia  Heme/Onc history: patient is a 45 year old African-American female with a past medical history significant for gastric bypass surgery in 2014.  She has also had history of endometriosis which affected her left kidney and she has required multiple excisions for the same.  She was almost to the point of losing her left kidney but was able to get that salvaged.  She has also had EGD and colonoscopy done in the past at [facility] in 2018.  Colonoscopy was normal and EGD showed 4 mm nodules in the stomach which were negative for malignancy she is currently on Lupron for endometriosis which was stopped about a year ago.  Patient presently does get her menstrual cycles but reports that they are not particularly heavy.  She has been referred to [place] for iron deficiency anemia.  Recent blood work from 08/07/2018 showed white count of 3.5, H&H of 12.5/38.4 with an MCV of 81 and a platelet count of 266.  CMP was within normal limits.  Iron saturation was low at 8%.  TSH was high at 4.6 with a low T3 of 23.  B12 was high normal at 1590.  Folate was normal.  Ferritin was low at 24.  Patient denies any consistent use of NSAIDs.  Denies any blood in her stools or dark melanotic stools.  Last received IV iron in January 2022.  Interval history-patient reports doing well and exercises almost on a daily basis.  Menstrual cycles are not particularly heavy.  Denies any bleeding in her stool or urine.  Denies any dark melanotic stools.  Reports feeling cold all the time which has been her chronic issue.  ECOG PS- 0 Pain scale-  0   Review of systems- Review of Systems  Constitutional:  Negative for chills, fever, malaise/fatigue and weight loss.  HENT:  Negative for congestion, ear discharge and nosebleeds.   Eyes:  Negative for blurred vision.  Respiratory:  Negative for cough, hemoptysis, sputum production, shortness of breath and wheezing.   Cardiovascular:  Negative for chest pain, palpitations, orthopnea and claudication.  Gastrointestinal:  Negative for abdominal pain, blood in stool, constipation, diarrhea, heartburn, melena, nausea and vomiting.  Genitourinary:  Negative for dysuria, flank pain, frequency, hematuria and urgency.  Musculoskeletal:  Negative for back pain, joint pain and myalgias.  Skin:  Negative for rash.  Neurological:  Negative for dizziness, tingling, focal weakness, seizures, weakness and headaches.  Endo/Heme/Allergies:  Does not bruise/bleed easily.  Psychiatric/Behavioral:  Negative for depression and suicidal ideas. The patient does not have insomnia.      Allergies  Allergen Reactions   Levofloxacin Hives    Other reaction(s): Unknown   Tape     Other reaction(s): Other (See Comments) Burns the skin.    Penicillins Swelling and Other (See Comments)     Past Medical History:  Diagnosis Date   Anemia    Anxiety disorder    Asthma    Congenital pes planus    Endometriosis    Endometriosis    Essential hypertension    Hypercholesterolemia    IBS (irritable bowel syndrome)    Migraine    Muscle spasm of back  Nerve root and plexus disorder, unspecified    Other intervertebral disc degeneration, lumbar region    Plantar fascial fibromatosis    Proteinuria    PTSD (post-traumatic stress disorder)      Past Surgical History:  Procedure Laterality Date   biopsy cervix single/mult/excision of lesion spx     CYSTOURETHROSCOPY     DILATION AND CURETTAGE OF UTERUS     endometrial bx conjunct w/ colposcopy     ESOPHAGOGASTRODUODENOSCOPY     laps gastrc rstrictiv  px longitudinal gastrectomy     TONSILLECTOMY     ureteroplasty plastic operation ureter      Social History   Socioeconomic History   Marital status: Single    Spouse name: Not on file   Number of children: Not on file   Years of education: Not on file   Highest education level: Not on file  Occupational History   Not on file  Tobacco Use   Smoking status: Never   Smokeless tobacco: Never  Vaping Use   Vaping Use: Never used  Substance and Sexual Activity   Alcohol use: Yes   Drug use: Never   Sexual activity: Yes    Birth control/protection: I.U.D.  Other Topics Concern   Not on file  Social History Narrative   Not on file   Social Determinants of Health   Financial Resource Strain: Not on file  Food Insecurity: Not on file  Transportation Needs: Not on file  Physical Activity: Not on file  Stress: Not on file  Social Connections: Not on file  Intimate Partner Violence: Not on file    Family History  Problem Relation Age of Onset   Leukemia Mother    Diabetes Father    Hypertension Father      Current Outpatient Medications:    acetaminophen (TYLENOL) 500 MG tablet, Take 500 mg by mouth every 6 (six) hours as needed. Combined with ibuprofen, Disp: , Rfl:    albuterol (PROVENTIL HFA;VENTOLIN HFA) 108 (90 Base) MCG/ACT inhaler, Inhale 2 puffs into the lungs as needed., Disp: , Rfl:    ARIPiprazole (ABILIFY) 10 MG tablet, Take 10 mg by mouth daily. , Disp: , Rfl:    b complex vitamins tablet, Take 1 tablet by mouth daily. , Disp: , Rfl:    Calcium 500-100 MG-UNIT CHEW, Chew 1 tablet by mouth daily. , Disp: , Rfl:    docusate sodium (COLACE) 100 MG capsule, Take 100 mg by mouth daily. , Disp: , Rfl:    EMGALITY 120 MG/ML SOAJ, , Disp: , Rfl:    etonogestrel-ethinyl estradiol (NUVARING) 0.12-0.015 MG/24HR vaginal ring, etonogestrel 0.12 mg-ethinyl estradiol 0.015 mg/24 hr vaginal ring, Disp: , Rfl:    fluticasone (FLONASE) 50 MCG/ACT nasal spray, fluticasone  propionate 50 mcg/actuation nasal spray,suspension, Disp: , Rfl:    Fluticasone-Salmeterol (ADVAIR) 250-50 MCG/DOSE AEPB, Inhale 1 puff into the lungs 2 (two) times a day. , Disp: , Rfl:    gabapentin (NEURONTIN) 300 MG capsule, Take 300 mg by mouth 3 (three) times daily., Disp: , Rfl:    linaclotide (LINZESS) 72 MCG capsule, Take 72 mcg by mouth daily before breakfast. , Disp: , Rfl:    montelukast (SINGULAIR) 10 MG tablet, Take 10 mg by mouth at bedtime. , Disp: , Rfl:    Multiple Vitamins-Minerals (COMPLETE MULTIVITAMIN/MINERAL) LIQD, Take 1 tablet by mouth daily. , Disp: , Rfl:    pantoprazole (PROTONIX) 40 MG tablet, , Disp: , Rfl:    prazosin (MINIPRESS) 1 MG capsule,  Take 1 mg by mouth daily. , Disp: , Rfl:    SUMAtriptan (IMITREX) 50 MG tablet, Take 50 mg by mouth daily., Disp: , Rfl:    traMADol (ULTRAM) 50 MG tablet, tramadol 50 mg tablet, Disp: , Rfl:    traZODone (DESYREL) 50 MG tablet, Take 1 tablet by mouth as needed., Disp: , Rfl:    gabapentin (NEURONTIN) 100 MG capsule, gabapentin 100 mg capsule, Disp: , Rfl:    triamcinolone ointment (KENALOG) 0.1 %, Apply 1 application topically 2 (two) times daily. (Patient not taking: Reported on 01/02/2021), Disp: 30 g, Rfl: 0  Physical exam:  Vitals:   01/02/21 1056  BP: (!) 120/93  Pulse: 63  Temp: (!) 96.4 F (35.8 C)  TempSrc: Tympanic  SpO2: 99%  Weight: 225 lb (102.1 kg)   Physical Exam Cardiovascular:     Rate and Rhythm: Normal rate and regular rhythm.     Heart sounds: Normal heart sounds.  Pulmonary:     Effort: Pulmonary effort is normal.     Breath sounds: Normal breath sounds.  Abdominal:     General: Bowel sounds are normal.     Palpations: Abdomen is soft.  Skin:    General: Skin is warm and dry.  Neurological:     Mental Status: She is alert and oriented to person, place, and time.     No flowsheet data found. CBC Latest Ref Rng & Units 01/02/2021  WBC 4.0 - 10.5 K/uL 4.9  Hemoglobin 12.0 - 15.0 g/dL 14.9   Hematocrit 36.0 - 46.0 % 42.8  Platelets 150 - 400 K/uL 270    No images are attached to the encounter.  No results found.   Assessment and plan- Patient is a 45 y.o. female with history of iron deficiency anemia here for a routine follow-up   Ferritin levels are mildly low at 27 but iron saturation Is 35%. Patient last received IV iron in January 2022.  Her hemoglobin presently is 14.9 which is within normal limits.  I will hold off on giving her any IV iron at this time and I would recommend that she should take oral iron either once a day or every other day.  Repeat CBC ferritin and iron studies in 3 in 6 months and I will see her back in 6 months.  If she has recurrence of iron deficiency anemia despite normal menstrual cycles she may need a GI evaluation again   Visit Diagnosis 1. Iron deficiency anemia, unspecified iron deficiency anemia type      Dr. Randa Evens, MD, MPH [facility] at [facility] [phone] 01/02/2021 1:04 PM

## 2021-01-03 ENCOUNTER — Other Ambulatory Visit: Payer: Self-pay

## 2021-01-03 ENCOUNTER — Encounter: Payer: Self-pay | Admitting: Oncology

## 2021-01-03 ENCOUNTER — Emergency Department
Admission: EM | Admit: 2021-01-03 | Discharge: 2021-01-03 | Disposition: A | Discharge status: Home / Self Care | Payer: Federal, State, Local not specified - PPO | Attending: Emergency Medicine | Admitting: Emergency Medicine

## 2021-01-03 DIAGNOSIS — W57XXXA Bitten or stung by nonvenomous insect and other nonvenomous arthropods, initial encounter: Secondary | ICD-10-CM | POA: Diagnosis not present

## 2021-01-03 DIAGNOSIS — I10 Essential (primary) hypertension: Secondary | ICD-10-CM | POA: Diagnosis not present

## 2021-01-03 DIAGNOSIS — S50862A Insect bite (nonvenomous) of left forearm, initial encounter: Secondary | ICD-10-CM | POA: Diagnosis not present

## 2021-01-03 DIAGNOSIS — J4520 Mild intermittent asthma, uncomplicated: Secondary | ICD-10-CM | POA: Insufficient documentation

## 2021-01-03 DIAGNOSIS — Z7951 Long term (current) use of inhaled steroids: Secondary | ICD-10-CM | POA: Diagnosis not present

## 2021-01-03 DIAGNOSIS — Z79899 Other long term (current) drug therapy: Secondary | ICD-10-CM | POA: Insufficient documentation

## 2021-01-03 MED ORDER — TRIAMCINOLONE ACETONIDE 0.1 % EX CREA: 1.0000 "application " | TOPICAL_CREAM | Freq: Four times a day (QID) | CUTANEOUS | 0 refills | Status: AC

## 2021-01-03 MED ORDER — SULFAMETHOXAZOLE-TRIMETHOPRIM 800-160 MG PO TABS: 1.0000 | ORAL_TABLET | Freq: Two times a day (BID) | ORAL | 0 refills | Status: AC

## 2021-01-03 NOTE — ED Triage Notes (Signed)
Pt presents via POV c/o "insect bites" to RUE. Pt reports itching to area. Also reports rash to buttocks per pt report.

## 2021-01-03 NOTE — ED Provider Notes (Signed)
[facility] Emergency Department Provider Note  ____________________________________________  Time seen: Approximately 7:19 PM  I have reviewed the triage vital signs and the nursing notes.   HISTORY  Chief Complaint Insect Bite    HPI Jacqueline Caldwell is a 45 y.o. female who presents the emergency department complaining to bumps to the left forearm.  Patient states that she went to bed and they were not present, woke up and they were present.  She concerned that she may have been bitten by a spider.  She states that there slightly pruritic but not painful.  No surrounding erythema or edema.  Thumbs are "blisterlike."  No history of recurrent skin infections.  No medications prior to arrival.       Past Medical History:  Diagnosis Date   Anemia    Anxiety disorder    Asthma    Congenital pes planus    Endometriosis    Endometriosis    Essential hypertension    Hypercholesterolemia    IBS (irritable bowel syndrome)    Migraine    Muscle spasm of back    Nerve root and plexus disorder, unspecified    Other intervertebral disc degeneration, lumbar region    Plantar fascial fibromatosis    Proteinuria    PTSD (post-traumatic stress disorder)     Patient Active Problem List   Diagnosis Date Noted   Endometriosis determined by laparoscopy 10/30/2018   Iron deficiency anemia 08/28/2018   Mild intermittent asthma without complication 74/16/3845   Acute bacterial conjunctivitis of both eyes 08/10/2017   Acute non-recurrent maxillary sinusitis 08/10/2017   Cough 08/10/2017   Acute cystitis with hematuria 07/22/2017   Ureteral mass 05/23/2017   DDD (degenerative disc disease), cervical 04/12/2017   Degeneration of lumbosacral intervertebral disc 04/12/2017   History of bariatric surgery 04/12/2017   Plantar fasciitis 03/23/2017   Bacteria in urine 03/04/2017   Absolute anemia 01/10/2017   Other fatigue 12/22/2016   History of colon polyps 11/07/2016    Menometrorrhagia 11/07/2016   Sepsis due to urinary tract infection ([place]) 11/07/2016   Ureteral obstruction, left 11/07/2016   Lumbar radiculopathy, chronic 11/03/2016   Other hydronephrosis 10/13/2016   Retroperitoneal mass 10/13/2016   Back muscle spasm 09/28/2016   Intramural leiomyoma of uterus 03/02/2016   Lower resp. tract infection 10/24/2015   Irregular menses 10/21/2015   Trichomonal vaginitis 10/21/2015   IUD mechanical complication 36/46/8032   Uterus, adenomyosis 01/15/2014   Anxiety state 10/31/2012   Essential hypertension 10/31/2012   Hematochezia 10/31/2012   Low back pain 10/31/2012   Obesity, Class III, BMI 40-49.9 (morbid obesity) ([place]) 10/31/2012   Posttraumatic stress disorder 10/31/2012   Vitamin D deficiency 10/31/2012    Past Surgical History:  Procedure Laterality Date   biopsy cervix single/mult/excision of lesion spx     CYSTOURETHROSCOPY     DILATION AND CURETTAGE OF UTERUS     endometrial bx conjunct w/ colposcopy     ESOPHAGOGASTRODUODENOSCOPY     laps gastrc rstrictiv px longitudinal gastrectomy     TONSILLECTOMY     ureteroplasty plastic operation ureter      Prior to Admission medications   Medication Sig Start Date End Date Taking? Authorizing Provider  sulfamethoxazole-trimethoprim (BACTRIM DS) 800-160 MG tablet Take 1 tablet by mouth 2 (two) times daily. 01/03/21  Yes Jayliah Benett, Charline Bills, PA-C  triamcinolone cream (KENALOG) 0.1 % Apply 1 application topically 4 (four) times daily. 01/03/21  Yes Lydie Stammen, Charline Bills, PA-C  acetaminophen (TYLENOL) 500 MG tablet  Take 500 mg by mouth every 6 (six) hours as needed. Combined with ibuprofen    [provider]  albuterol (PROVENTIL HFA;VENTOLIN HFA) 108 (90 Base) MCG/ACT inhaler Inhale 2 puffs into the lungs as needed.    [provider]  ARIPiprazole (ABILIFY) 10 MG tablet Take 10 mg by mouth daily.     [provider]  b complex vitamins tablet Take 1 tablet by mouth  daily.     [provider]  Calcium 500-100 MG-UNIT CHEW Chew 1 tablet by mouth daily.     [provider]  docusate sodium (COLACE) 100 MG capsule Take 100 mg by mouth daily.     [provider]  [facility] 120 MG/ML SOAJ  12/25/18   [provider]  etonogestrel-ethinyl estradiol (NUVARING) 0.12-0.015 MG/24HR vaginal ring etonogestrel 0.12 mg-ethinyl estradiol 0.015 mg/24 hr vaginal ring 05/05/20   [provider]  fluticasone (FLONASE) 50 MCG/ACT nasal spray fluticasone propionate 50 mcg/actuation nasal spray,suspension    [provider]  Fluticasone-Salmeterol (ADVAIR) 250-50 MCG/DOSE AEPB Inhale 1 puff into the lungs 2 (two) times a day.  01/27/18   [provider]  gabapentin (NEURONTIN) 100 MG capsule gabapentin 100 mg capsule    [provider]  gabapentin (NEURONTIN) 300 MG capsule Take 300 mg by mouth 3 (three) times daily.    [provider]  linaclotide (LINZESS) 72 MCG capsule Take 72 mcg by mouth daily before breakfast.     [provider]  montelukast (SINGULAIR) 10 MG tablet Take 10 mg by mouth at bedtime.  08/20/09   [provider]  Multiple Vitamins-Minerals (COMPLETE MULTIVITAMIN/MINERAL) LIQD Take 1 tablet by mouth daily.     [provider]  pantoprazole (PROTONIX) 40 MG tablet  12/26/18   [provider]  prazosin (MINIPRESS) 1 MG capsule Take 1 mg by mouth daily.  08/02/18   [provider]  SUMAtriptan (IMITREX) 50 MG tablet Take 50 mg by mouth daily. 08/02/18   [provider]  traMADol (ULTRAM) 50 MG tablet tramadol 50 mg tablet    [provider]  traZODone (DESYREL) 50 MG tablet Take 1 tablet by mouth as needed. 01/08/16   [provider]    Allergies Levofloxacin, Tape, and Penicillins  Family History  Problem Relation Age of Onset   Leukemia Mother    Diabetes Father    Hypertension Father     Social History Social  History   Tobacco Use   Smoking status: Never   Smokeless tobacco: Never  Vaping Use   Vaping Use: Never used  Substance Use Topics   Alcohol use: Yes   Drug use: Never     Review of Systems  Constitutional: No fever/chills Eyes: No visual changes. No discharge ENT: No upper respiratory complaints. Cardiovascular: no chest pain. Respiratory: no cough. No SOB. Gastrointestinal: No abdominal pain.  No nausea, no vomiting.  No diarrhea.  No constipation. Musculoskeletal: Negative for musculoskeletal pain. Skin: Possible bug/spider bite to the left forearm Neurological: Negative for headaches, focal weakness or numbness.  10 System ROS otherwise negative.  ____________________________________________   PHYSICAL EXAM:  VITAL SIGNS: ED Triage Vitals  Enc Vitals Group     BP 01/03/21 1654 126/70     Pulse Rate 01/03/21 1654 83     Resp 01/03/21 1654 18     Temp 01/03/21 1654 98.8 F (37.1 C)     Temp Source 01/03/21 1654 Oral     SpO2 01/03/21 1654 96 %  Weight 01/03/21 1655 223 lb (101.2 kg)     Height 01/03/21 1655 5\' 8"  (1.727 m)     Head Circumference --      Peak Flow --      Pain Score 01/03/21 1700 0     Pain Loc --      Pain Edu? --      Excl. in [place]? --      Constitutional: Alert and oriented. Well appearing and in no acute distress. Eyes: Conjunctivae are normal. PERRL. EOMI. Head: Atraumatic. ENT:      Ears:       Nose: No congestion/rhinnorhea.      Mouth/Throat: Mucous membranes are moist.  Neck: No stridor.    Cardiovascular: Normal rate, regular rhythm. Normal S1 and S2.  Good peripheral circulation. Respiratory: Normal respiratory effort without tachypnea or retractions. Lungs CTAB. Good air entry to the bases with no decreased or absent breath sounds. Musculoskeletal: Full range of motion to all extremities. No gross deformities appreciated.  Visualization of the left forearm reveals 2 vesicles closely grouped to the left forearm.  No  surrounding erythema or edema.  Nontender to palpation.  Other than the physical itself there is no palpable abnormality about the left forearm. Neurologic:  Normal speech and language. No gross focal neurologic deficits are appreciated.  Skin:  Skin is warm, dry and intact. No rash noted. Psychiatric: Mood and affect are normal. Speech and behavior are normal. Patient exhibits appropriate insight and judgement.   ____________________________________________   LABS (all labs ordered are listed, but only abnormal results are displayed)  Labs Reviewed - No data to display ____________________________________________  EKG   ____________________________________________  RADIOLOGY   No results found.  ____________________________________________    PROCEDURES  Procedure(s) performed:    Procedures    Medications - No data to display   ____________________________________________   INITIAL IMPRESSION / ASSESSMENT AND PLAN / ED COURSE  Pertinent labs & imaging results that were available during my care of the patient were reviewed by me and considered in my medical decision making (see chart for details).  Review of the [place] CSRS was performed in accordance of the [place] prior to dispensing any controlled drugs.           Patient's diagnosis is consistent with insect bite.  Patient presented to the emergency department 2 vesicles to the left forearm.  She was concerned she may have been bitten by a spider.  This does not appear to be either a black widow or brown recluse bite.  Unsure type of insect however.  There is no evidence of surrounding cellulitis.  I discussed the differential to include a spider bite with the patient and will prescribe topical triamcinolone for primary symptom relief.  The patient started to develop erythema, edema or other signs of spider bite she should start the antibiotics.  Patient is agreeable with this plan.  Follow-up precautions discussed  with the patient.  Follow-up primary care as needed..  Patient is given ED precautions to return to the ED for any worsening or new symptoms.     ____________________________________________  FINAL CLINICAL IMPRESSION(S) / ED DIAGNOSES  Final diagnoses:  Insect bite of left forearm, initial encounter      NEW MEDICATIONS STARTED DURING THIS VISIT:  ED Discharge Orders          Ordered    sulfamethoxazole-trimethoprim (BACTRIM DS) 800-160 MG tablet  2 times daily        01/03/21 2025  triamcinolone cream (KENALOG) 0.1 %  4 times daily        01/03/21 2026                This chart was dictated using voice recognition software/Dragon. Despite best efforts to proofread, errors can occur which can change the meaning. Any change was purely unintentional.    Darletta Moll, PA-C 01/03/21 2035    Harvest Dark, MD 01/03/21 2337

## 2021-01-03 NOTE — ED Notes (Signed)
Pt A&Ox4, ambulatory at d/c with independent steady gait, NAD. Pt verbalized understanding of d/c instructions, prescriptions and follow up care.

## 2021-04-06 ENCOUNTER — Inpatient Hospital Stay: Payer: Federal, State, Local not specified - PPO | Attending: Oncology

## 2021-06-30 ENCOUNTER — Other Ambulatory Visit: Payer: Self-pay | Admitting: *Deleted

## 2021-06-30 DIAGNOSIS — D509 Iron deficiency anemia, unspecified: Secondary | ICD-10-CM

## 2021-07-06 IMAGING — MG DIGITAL SCREENING BILAT W/ TOMO W/ CAD
8 series · 8 of 24 positions shown · non-contrast
Comparison: Previous exam(s).

CLINICAL DATA: Screening.

EXAM:
DIGITAL SCREENING BILATERAL MAMMOGRAM WITH TOMO AND CAD

[L MLO synth-2D]
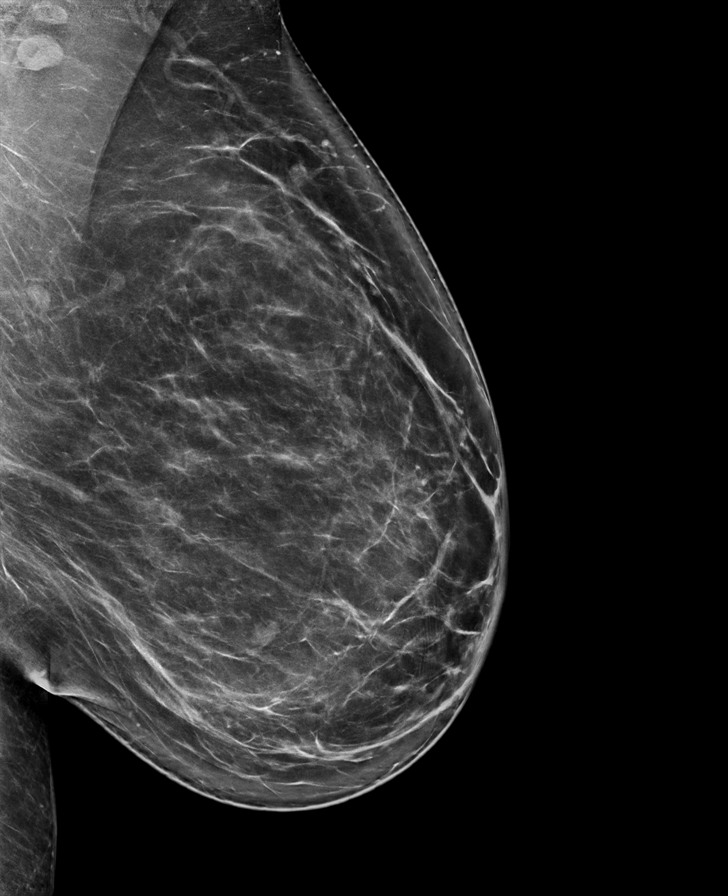

[L CC synth-2D]
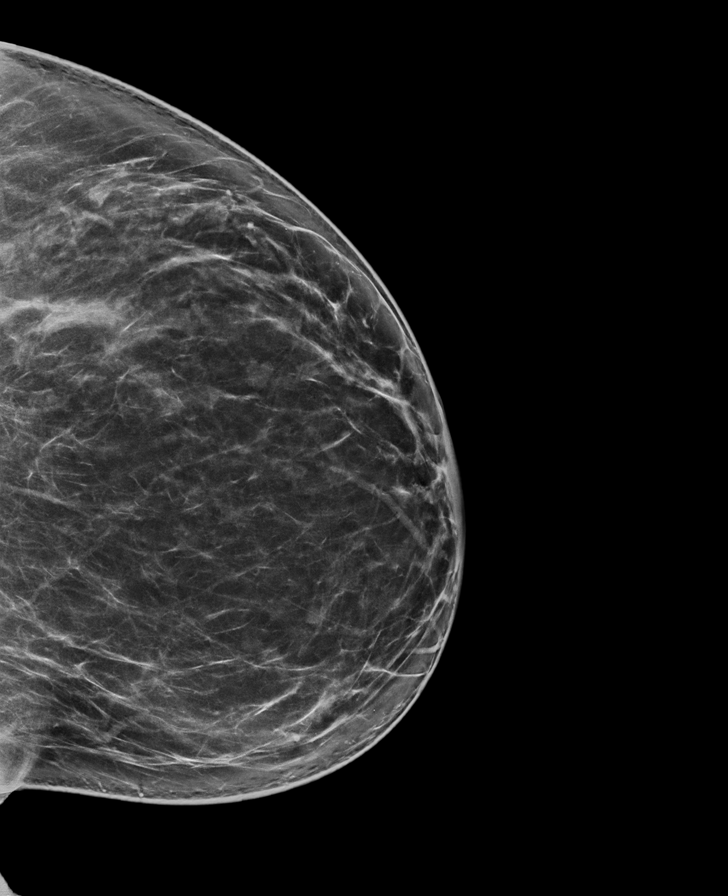

[R MLO synth-2D]
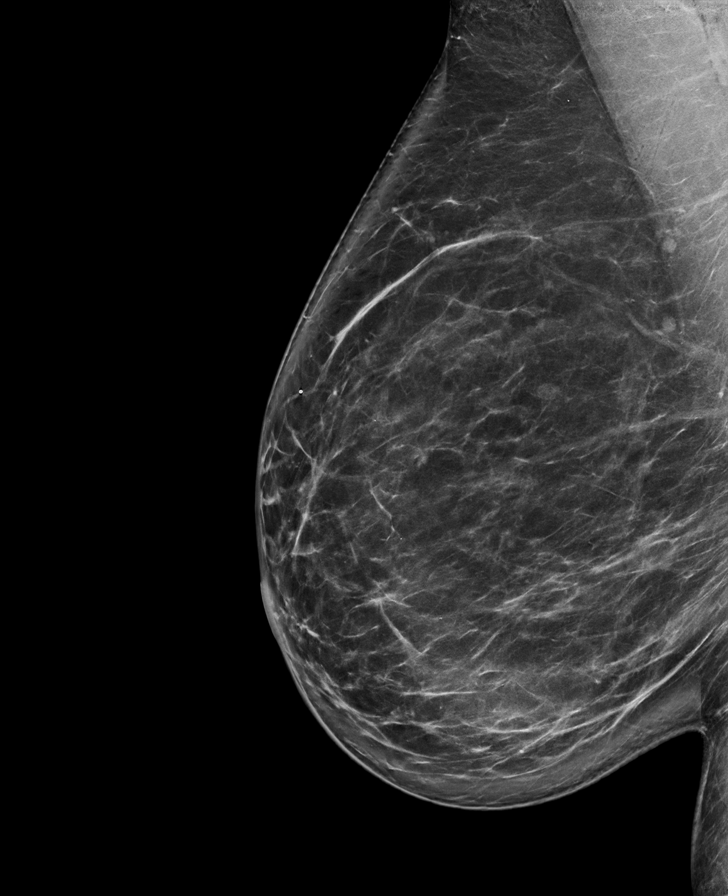

[R CC synth-2D]
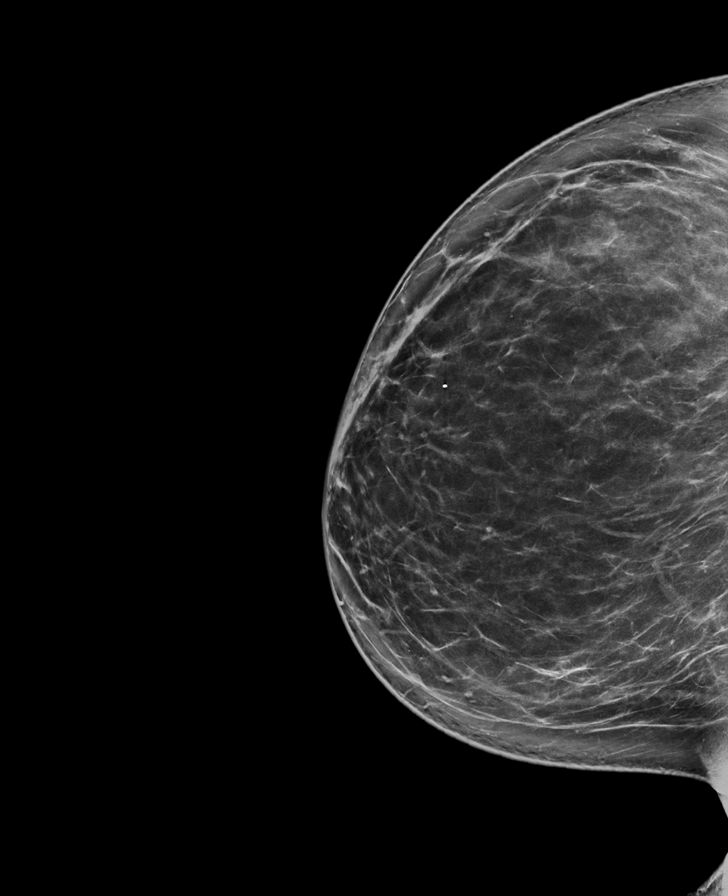

[R MLO tomo · tomo slice 51/101.0]
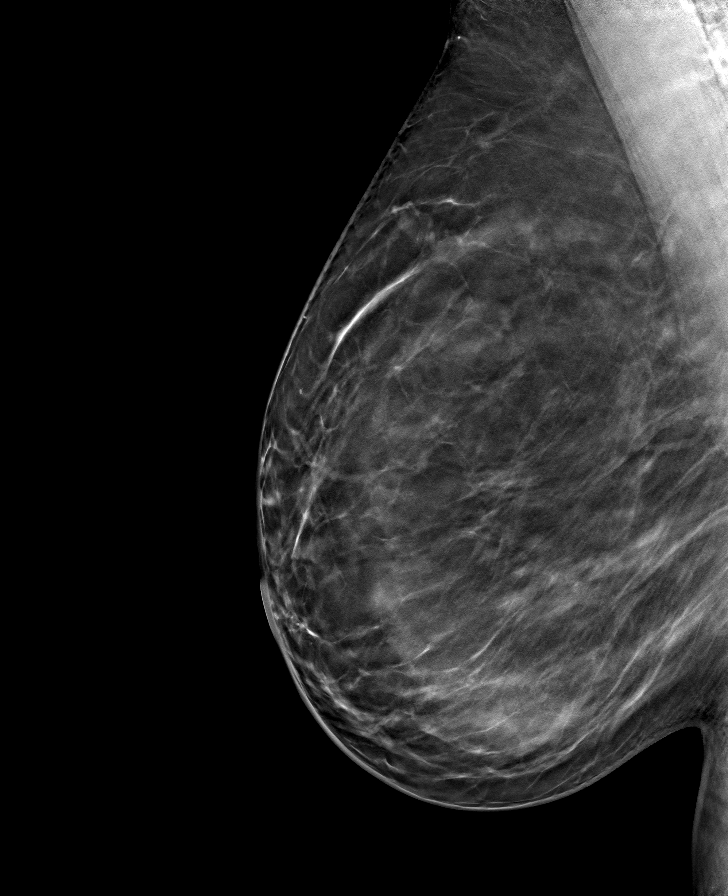

[L MLO tomo · tomo slice 52/103.0]
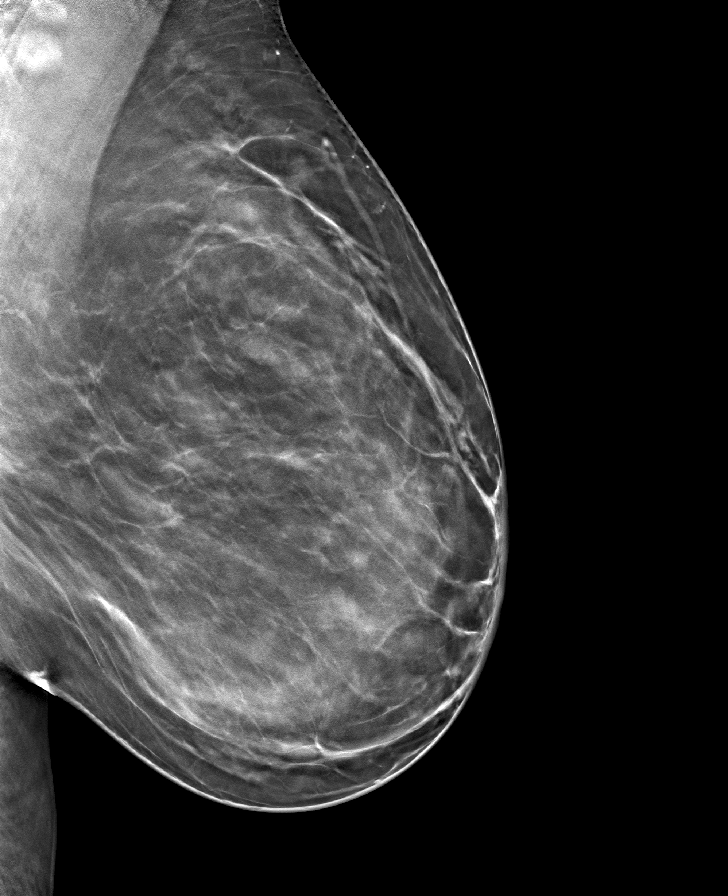

[L CC tomo · tomo slice 46/91.0]
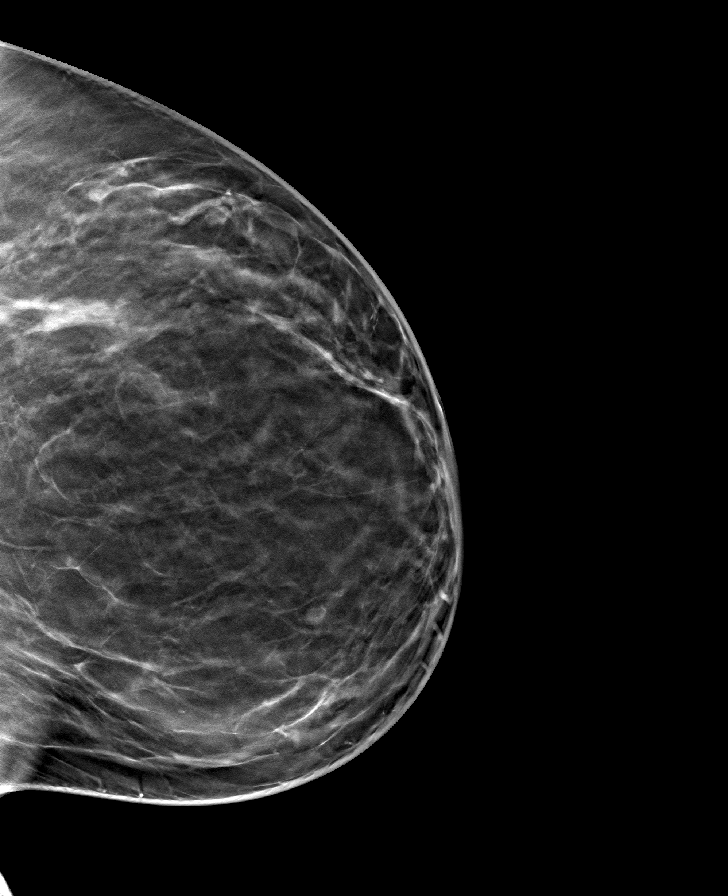

[R CC tomo · tomo slice 49/96.0]
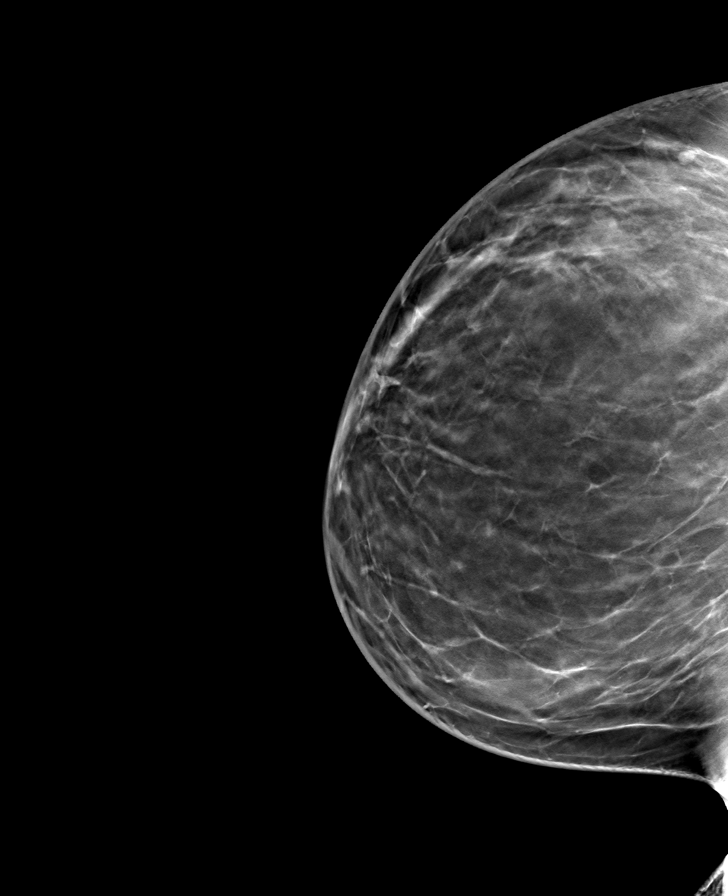

[8 of 24 positions shown; findings below may reference images not displayed]

ACR Breast Density Category b: There are scattered areas of
fibroglandular density.
FINDINGS: There are no findings suspicious for malignancy. Images were
processed with CAD.
IMPRESSION: No mammographic evidence of malignancy. A result letter of this
screening mammogram will be mailed directly to the patient.

RECOMMENDATION:
Screening mammogram in one year. (Code:[id number])

BI-RADS CATEGORY  1: Negative.

## 2021-07-07 ENCOUNTER — Inpatient Hospital Stay: Payer: Federal, State, Local not specified - PPO

## 2021-07-10 ENCOUNTER — Other Ambulatory Visit: Payer: Self-pay

## 2021-07-10 ENCOUNTER — Encounter: Payer: Self-pay | Admitting: Nurse Practitioner

## 2021-07-10 ENCOUNTER — Inpatient Hospital Stay (HOSPITAL_BASED_OUTPATIENT_CLINIC_OR_DEPARTMENT_OTHER): Payer: Federal, State, Local not specified - PPO | Admitting: Nurse Practitioner

## 2021-07-10 NOTE — Progress Notes (Signed)
Patient didn't answer for virtual visits nor has she had labs done. Advised to reschedule both.
